# Patient Record
Sex: Male | Born: 1960 | Race: Black or African American | Hispanic: No | Marital: Single | Smoking: Never smoker
Health system: Southern US, Community
[De-identification: ages and names within clinical notes are randomized; demographics above are authoritative.]

## PROBLEM LIST (undated history)

## (undated) DIAGNOSIS — N529 Male erectile dysfunction, unspecified: Secondary | ICD-10-CM

## (undated) DIAGNOSIS — I10 Essential (primary) hypertension: Secondary | ICD-10-CM

## (undated) DIAGNOSIS — R002 Palpitations: Secondary | ICD-10-CM

## (undated) DIAGNOSIS — K4020 Bilateral inguinal hernia, without obstruction or gangrene, not specified as recurrent: Secondary | ICD-10-CM

## (undated) DIAGNOSIS — K219 Gastro-esophageal reflux disease without esophagitis: Secondary | ICD-10-CM

## (undated) DIAGNOSIS — E669 Obesity, unspecified: Secondary | ICD-10-CM

## (undated) DIAGNOSIS — R000 Tachycardia, unspecified: Secondary | ICD-10-CM

## (undated) DIAGNOSIS — I209 Angina pectoris, unspecified: Secondary | ICD-10-CM

## (undated) DIAGNOSIS — D649 Anemia, unspecified: Secondary | ICD-10-CM

## (undated) DIAGNOSIS — I499 Cardiac arrhythmia, unspecified: Secondary | ICD-10-CM

## (undated) DIAGNOSIS — E785 Hyperlipidemia, unspecified: Secondary | ICD-10-CM

## (undated) DIAGNOSIS — C801 Malignant (primary) neoplasm, unspecified: Secondary | ICD-10-CM

## (undated) DIAGNOSIS — G4730 Sleep apnea, unspecified: Secondary | ICD-10-CM

## (undated) DIAGNOSIS — R001 Bradycardia, unspecified: Secondary | ICD-10-CM

## (undated) HISTORY — PX: HERNIA REPAIR: SHX51

## (undated) HISTORY — PX: INGUINAL HERNIA REPAIR: SUR1180

---

## 2005-02-13 ENCOUNTER — Ambulatory Visit: Payer: Self-pay | Admitting: Internal Medicine

## 2005-08-16 ENCOUNTER — Other Ambulatory Visit: Payer: Self-pay

## 2005-08-16 ENCOUNTER — Emergency Department: Payer: Self-pay | Admitting: Emergency Medicine

## 2006-04-21 ENCOUNTER — Ambulatory Visit: Payer: Self-pay | Admitting: Gastroenterology

## 2006-08-11 ENCOUNTER — Ambulatory Visit: Payer: Self-pay | Admitting: Internal Medicine

## 2010-03-13 ENCOUNTER — Emergency Department: Payer: Self-pay | Admitting: Unknown Physician Specialty

## 2010-09-01 DIAGNOSIS — C801 Malignant (primary) neoplasm, unspecified: Secondary | ICD-10-CM

## 2010-09-01 DIAGNOSIS — C61 Malignant neoplasm of prostate: Secondary | ICD-10-CM

## 2010-09-01 HISTORY — DX: Malignant (primary) neoplasm, unspecified: C80.1

## 2010-09-01 HISTORY — PX: PROSTATECTOMY: SHX69

## 2010-09-01 HISTORY — DX: Malignant neoplasm of prostate: C61

## 2011-11-12 ENCOUNTER — Ambulatory Visit: Payer: Self-pay | Admitting: Family

## 2012-03-16 ENCOUNTER — Ambulatory Visit: Payer: Self-pay | Admitting: Cardiovascular Disease

## 2012-03-16 DIAGNOSIS — I2510 Atherosclerotic heart disease of native coronary artery without angina pectoris: Secondary | ICD-10-CM

## 2012-03-16 HISTORY — DX: Atherosclerotic heart disease of native coronary artery without angina pectoris: I25.10

## 2012-03-16 HISTORY — PX: LEFT HEART CATH AND CORONARY ANGIOGRAPHY: CATH118249

## 2012-10-19 ENCOUNTER — Other Ambulatory Visit: Payer: Self-pay | Admitting: Neurosurgery

## 2012-10-19 DIAGNOSIS — M792 Neuralgia and neuritis, unspecified: Secondary | ICD-10-CM

## 2012-10-19 DIAGNOSIS — M545 Low back pain: Secondary | ICD-10-CM

## 2012-10-27 ENCOUNTER — Other Ambulatory Visit: Payer: Self-pay

## 2012-11-23 ENCOUNTER — Inpatient Hospital Stay
Admission: RE | Admit: 2012-11-23 | Discharge: 2012-11-23 | Disposition: A | Discharge status: Home / Self Care | Payer: Self-pay | Source: Ambulatory Visit | Attending: Neurosurgery | Admitting: Neurosurgery

## 2012-11-23 ENCOUNTER — Other Ambulatory Visit: Payer: Self-pay | Admitting: Neurosurgery

## 2012-11-23 ENCOUNTER — Inpatient Hospital Stay: Admission: RE | Admit: 2012-11-23 | Payer: Self-pay | Source: Ambulatory Visit

## 2012-11-23 DIAGNOSIS — M549 Dorsalgia, unspecified: Secondary | ICD-10-CM

## 2013-03-01 ENCOUNTER — Ambulatory Visit
Admission: RE | Admit: 2013-03-01 | Discharge: 2013-03-01 | Disposition: A | Discharge status: Home / Self Care | Payer: BC Managed Care – PPO | Source: Ambulatory Visit | Attending: Neurosurgery | Admitting: Neurosurgery

## 2013-03-01 VITALS — BP 114/73 | HR 67 | Ht 76.0 in | Wt 255.0 lb

## 2013-03-01 DIAGNOSIS — M792 Neuralgia and neuritis, unspecified: Secondary | ICD-10-CM

## 2013-03-01 DIAGNOSIS — M545 Low back pain: Secondary | ICD-10-CM

## 2013-03-01 MED ORDER — IOHEXOL 180 MG/ML  SOLN
15.0000 mL | Freq: Once | INTRAMUSCULAR | Status: AC | PRN
Start: 2013-03-01 — End: 2013-03-01
  Administered 2013-03-01: 15 mL via INTRATHECAL

## 2013-03-01 MED ORDER — DIAZEPAM 5 MG PO TABS
10.0000 mg | ORAL_TABLET | Freq: Once | ORAL | Status: AC
  Administered 2013-03-01: 10 mg via ORAL

## 2013-03-01 NOTE — Progress Notes (Signed)
Last dose of tramadol was Saturday. Discharge instructions explained.

## 2013-03-07 ENCOUNTER — Other Ambulatory Visit: Payer: Self-pay

## 2013-06-01 DIAGNOSIS — B351 Tinea unguium: Secondary | ICD-10-CM

## 2016-09-01 HISTORY — PX: COLONOSCOPY: SHX174

## 2017-11-30 ENCOUNTER — Other Ambulatory Visit: Payer: Self-pay | Admitting: Urology

## 2017-11-30 DIAGNOSIS — N5089 Other specified disorders of the male genital organs: Secondary | ICD-10-CM

## 2017-12-04 ENCOUNTER — Ambulatory Visit
Admission: RE | Admit: 2017-12-04 | Discharge: 2017-12-04 | Disposition: A | Discharge status: Home / Self Care | Payer: BLUE CROSS/BLUE SHIELD | Source: Ambulatory Visit | Attending: Urology | Admitting: Urology

## 2017-12-04 DIAGNOSIS — N5089 Other specified disorders of the male genital organs: Secondary | ICD-10-CM

## 2017-12-04 DIAGNOSIS — N509 Disorder of male genital organs, unspecified: Secondary | ICD-10-CM | POA: Insufficient documentation

## 2017-12-04 DIAGNOSIS — N433 Hydrocele, unspecified: Secondary | ICD-10-CM | POA: Diagnosis not present

## 2018-07-20 ENCOUNTER — Encounter
Admission: RE | Admit: 2018-07-20 | Discharge: 2018-07-20 | Disposition: A | Discharge status: Home / Self Care | Payer: BLUE CROSS/BLUE SHIELD | Source: Ambulatory Visit | Attending: Urology | Admitting: Urology

## 2018-07-20 ENCOUNTER — Other Ambulatory Visit: Payer: Self-pay

## 2018-07-20 HISTORY — DX: Hyperlipidemia, unspecified: E78.5

## 2018-07-20 HISTORY — DX: Malignant (primary) neoplasm, unspecified: C80.1

## 2018-07-20 HISTORY — DX: Tachycardia, unspecified: R00.0

## 2018-07-20 HISTORY — DX: Cardiac arrhythmia, unspecified: I49.9

## 2018-07-20 HISTORY — DX: Anemia, unspecified: D64.9

## 2018-07-20 NOTE — Patient Instructions (Signed)
Your procedure is scheduled on: 07-27-18 Report to Same Day Surgery 2nd floor medical mall [facility] Entrance-take elevator on left to 2nd floor.  Check in with surgery information desk.) To find out your arrival time please call [phone] between 1PM - 3PM on 07-26-18  Remember: Instructions that are not followed completely may result in serious medical risk, up to and including death, or upon the discretion of your surgeon and anesthesiologist your surgery may need to be rescheduled.    _x___ 1. Do not eat food after midnight the night before your procedure. You may drink clear liquids up to 2 hours before you are scheduled to arrive at the hospital for your procedure.  Do not drink clear liquids within 2 hours of your scheduled arrival to the hospital.  Clear liquids include  --Water or Apple juice without pulp  --Clear carbohydrate beverage such as ClearFast or Gatorade  --Black Coffee or Clear Tea (No milk, no creamers, do not add anything to the coffee or Tea   ____Ensure clear carbohydrate drink on the way to the hospital for bariatric patients  ____Ensure clear carbohydrate drink 3 hours before surgery for Dr Dwyane Luo patients if physician instructed.   No gum chewing or hard candies.     __x__ 2. No Alcohol for 24 hours before or after surgery.   __x__3. No Smoking or e-cigarettes for 24 prior to surgery.  Do not use any chewable tobacco products for at least 6 hour prior to surgery   ____  4. Bring all medications with you on the day of surgery if instructed.    __x__ 5. Notify your doctor if there is any change in your medical condition     (cold, fever, infections).    x___6. On the morning of surgery brush your teeth with toothpaste and water.  You may rinse your mouth with mouth wash if you wish.  Do not swallow any toothpaste or mouthwash.   Do not wear jewelry, make-up, hairpins, clips or nail polish.  Do not wear lotions, powders, or perfumes. You may wear  deodorant.  Do not shave 48 hours prior to surgery. Men may shave face and neck.  Do not bring valuables to the hospital.    [facility] is not responsible for any belongings or valuables.               Contacts, dentures or bridgework may not be worn into surgery.  Leave your suitcase in the car. After surgery it may be brought to your room.  For patients admitted to the hospital, discharge time is determined by your  treatment team.  _  Patients discharged the day of surgery will not be allowed to drive home.  You will need someone to drive you home and stay with you the night of your procedure.    Please read over the following fact sheets that you were given:   [facility] Preparing for Surgery   ____ Take anti-hypertensive listed below, cardiac, seizure, asthma, anti-reflux and psychiatric medicines. These include:  1. NONE  2.  3.  4.  5.  6.  ____Fleets enema or Magnesium Citrate as directed.   ____ Use CHG Soap or sage wipes as directed on instruction sheet   ____ Use inhalers on the day of surgery and bring to hospital day of surgery  ____ Stop Metformin and Janumet 2 days prior to surgery.    ____ Take 1/2 of usual insulin dose the night before surgery and none  on the morning surgery.   _X___ Follow recommendations from Cardiologist, Pulmonologist or PCP regarding stopping Aspirin, Coumadin, Plavix ,Eliquis, Effient, or Pradaxa, and Pletal-ASK DR WOLFF ABOUT STOPPING YOUR ASPIRIN  X____Stop Anti-inflammatories such as Advil, Aleve, Ibuprofen, Motrin, Naproxen, Naprosyn, Goodies powders or aspirin products NOW- OK to take Tylenol    _X___ Stop supplements until after surgery-STOP FISH OIL AND VIT C NOW-MAY RESUME AFTER SURGERY   ____ Bring C-Pap to the hospital.

## 2018-07-20 NOTE — Pre-Procedure Instructions (Signed)
Progress Notes - documented in this encounter  Brett Gibbon, Bell - 05/26/2018 9:15 AM EDT Formatting of this note might be different from the original. Established Patient Visit   Chief Complaint: Chief Complaint  Patient presents with  . Follow-up  PALPS  Date of Service: 05/26/2018 Date of Birth: 21-Feb-1961 PCP: Brett Bell  History of Present Illness: Brett Bell is a 57 y.o.male patient who states he do not visibly well history of palpitations tachycardia had an episode recently which prompted a follow-up visit the patient is scheduled to go on a cruise with his brother tomorrow and he wanted to get checked out patient scheduled medicine [facility]ell Done in [place] he tries to exercise about 10 minutes a day but recently had another episode of palpitation tachycardia is been under a lot of stress recently denies any significant chest pain  Past Medical and Surgical History  Past Medical History Past Medical History:  Diagnosis Date  . History of palpitations  . History of prostate cancer  followed by Dr. Yves Bell  . Hyperlipidemia   Past Surgical History He has a past surgical history that includes Prostate cancer surgery, 2012; Colonoscopy (10/11/2012); and Colonoscopy (11/18/2016).   Medications and Allergies  Current Medications  Current Outpatient Medications  Medication Sig Dispense Refill  . ascorbic acid, vitamin C, (VITAMIN C) 250 MG tablet Take 250 mg by mouth once daily.  Marland Kitchen aspirin 81 MG EC tablet Take 81 mg by mouth once daily.  Marland Kitchen atorvastatin (LIPITOR) 20 MG tablet Take 20 mg by mouth once daily  . metoprolol succinate (TOPROL-XL) 25 MG XL tablet Take 25 mg by mouth once daily  . omega-3 fatty acids-vitamin E (FISH OIL) 1,000 mg Take 1 capsule by mouth once daily.  . predniSONE (DELTASONE) 10 MG tablet 6 pills x 1 day, 5 pills x 1 day, 4 pills x 1 day, 3 pills x 1 day, 2 pills x 1 day. 1 pill x 1 day. (Patient not taking: Reported on 04/30/2018 ) 21 tablet 0   . PSYLLIUM HUSK (METAMUCIL ORAL) Take by mouth once daily as needed.  . tadalafil (CIALIS) 5 MG tablet Take 5 mg by mouth as needed for Erectile Dysfunction.   No current facility-administered medications for this visit.   Allergies: Patient has no known allergies.  Social and Family History  Social History reports that he has never smoked. He has never used smokeless tobacco. He reports that he does not drink alcohol or use drugs.  Family History Family History  Problem Relation Age of Onset  . Colon cancer Mother  . Prostate cancer Father  . No Known Problems Sister  . Kidney cancer Brother  . Prostate cancer Brother  . No Known Problems Sister  . Colon polyps Neg Hx   Review of Systems   Review of Systems: The patient denies chest pain, shortness of breath, orthopnea, paroxysmal nocturnal dyspnea, pedal edema, palpitations, heart racing, presyncope, syncope. Review of 12 Systems is negative except as described above.  Physical Examination   Vitals:BP 114/60  Pulse 61  Ht 188 cm (6\' 2" )  Wt (!) 117.1 kg (258 lb 2.5 oz)  SpO2 95%  BMI 33.15 kg/m  Ht:188 cm (6\' 2" ) Wt:(!) 117.1 kg (258 lb 2.5 oz) YHC:WCBJ surface area is 2.47 meters squared. Bell mass index is 33.15 kg/m.  HEENT: Pupils equally reactive to light and accomodation  Neck: Supple without thyromegaly, carotid pulses 2+ Lungs: clear to auscultation bilaterally; no wheezes, rales, rhonchi Heart: Regular rate  and rhythm. No gallops, murmurs or rub Abdomen: soft nontender, nondistended, with normal bowel sounds Extremities: no cyanosis, clubbing, or edema Peripheral Pulses: 2+ in all extremities, 2+ femoral pulses bilaterally  Assessment   57 y.o. male with  1. Palpitation  2. Vertigo  3. Pure hypercholesterolemia (LDL 96 - 04/27/18)  4. Obesity (BMI 30.0-34.9), unspecified  5. History of prostate cancer  6. History of palpitations  7. Bradycardia   Plan  1 palpitations chronic intermittent  recurrent we will continue low-dose metoprolol 2 tachycardia again related to recent symptoms of palpitation would maintain beta-blockade therapy 3 modest obesity recommend modest weight loss exercise portion control 4 hypertension reasonably controlled continue metoprolol 5 obesity mild recommend weight loss exercise portion control 6 prostate cancer by history stable follow-up urology and oncology physician 7 we will try to obtain Holter report from previous cardiologist 8 have the patient follow-up in 6 months  Return in about 6 months (around 11/24/2018).  Brett Prince [place], Bell  This dictation was prepared with dragon dictation. Any transcription errors that result from this process are unintentional.   Electronically signed by Brett Gibbon, Bell at 05/31/2018 12:47 PM EDT  Plan of Treatment - documented as of this encounter  Upcoming Encounters Upcoming Encounters  Date Type Specialty Care Team Description  10/28/2018 Ancillary Orders Lab Brett Bell  [place]  [facility] [place], [place] [address]  [phone]  [phone] (Fax)    11/04/2018 Office Visit Internal Medicine Brett Bell  [place]  [facility] [place], [place] [address]  [phone]  [phone]    11/23/2018 Office Visit Cardiology Brett Gibbon, Bell  [facility]  [place], [place] [address]  [phone]  [phone] (Fax)    Goals - documented as of this encounter  Goal Patient Goal Type Associated Problems Recent Progress Patient-Stated? Author  Lose Weight  Weight   Yes Brett Moll, RN  Note:   Lose 20 pounds from 10/29/17 to 03/2018    Visit Diagnoses - documented in this encounter  Diagnosis  Palpitation - Primary  Palpitations   Vertigo  Dizziness and giddiness   Pure hypercholesterolemia (LDL 96 - 04/27/18)  Pure  hypercholesterolemia   Obesity (BMI 30.0-34.9), unspecified   History of prostate cancer  Personal history of malignant neoplasm of prostate   History of palpitations   Bradycardia  Other specified cardiac dysrhythmias   Images Patient Contacts   Contact Name Contact Address Communication Relationship to Patient  Brett Bell Unknown [phone] [facility]) Brother or Sister, Emergency Contact  Document Information  Primary Care Provider Other Service Providers Document Coverage Dates  Brett Bell (Apr. 08, 2015April 08, 2015 - Present) DM: [phone] (Work) [phone] (Fax) [place] [facility] [place], [place] [address] Family Medicine [facility] [address] [place], [place] [address]  Sep. 25, 2019September 25, 2019   [place] [address] [place], [place] [address]   Encounter Providers Encounter Date  Brett Aida Raider, Bell (Attending) DM: [phone] (Work) [phone] (Fax) [place], [place] [address] Cardiovascular Disease Sep. 25, 2019September 25, 2019

## 2018-07-23 NOTE — H&P (Signed)
NAME: Brett, Bell MEDICAL RECORD [id number]:[id number] ACCOUNT [id number] DATE OF BIRTH:04/25/1961 FACILITY: [facility] LOCATION: [facility] PHYSICIAN:Denell Cothern Farrel Conners, MD  HISTORY AND PHYSICAL  DATE OF ADMISSION:  07/27/2018  SAME DAY SURGERY:  11/26  CHIEF COMPLAINT:  Scrotal swelling.  HISTORY OF PRESENT ILLNESS:  The patient is a 57 year old African-American male with a scrotal and perineal mass for several years.  The mass causes discomfort on occasion.  Scrotal ultrasound in April revealed that he had a 7.6 cm hypoechoic mass in the  perineum in the lower aspect of the mid scrotum.  He comes in now for excisional biopsy of this mass.  PAST MEDICAL HISTORY:   ALLERGIES:  No drug allergies.  CURRENT MEDICATIONS:  Crestor, metoprolol, vitamin C and Viagra.  PAST SURGICAL HISTORY:   1.  Circumcision in 1979. 2.  Left inguinal herniorrhaphy 2004.   3.  HIFU for prostate cancer in 2012.  PAST AND CURRENT MEDICAL CONDITIONS:   1.  Hypercholesterolemia. 2.  History of heart palpitations. 3.  Prostate cancer. 4.  Erectile dysfunction.  REVIEW OF SYSTEMS:  The patient denies chest pain, shortness of breath, diabetes, stroke or hypertension.  SOCIAL HISTORY:  The patient denied tobacco or alcohol use.  FAMILY HISTORY:  Father died at age 82 of heart disease and prostate cancer.  Mother died at age 30 of colon cancer.  PHYSICAL EXAMINATION: GENERAL:  A well-nourished African-American male in no distress. HEENT:  Sclerae are clear.  Pupils are equally round, reactive to light and accommodation.  Extraocular movements are intact. NECK:  No palpable cervical adenopathy. PULMONARY:  Lungs clear to auscultation. CARDIOVASCULAR:  Regular rhythm and rate without audible murmurs. ABDOMEN:  Soft, nontender abdomen. GENITOURINARY:  The patient was uncircumcised.  Testes were smooth, nontender, 20 mL size each.  He had a perineal mass in the lower aspect of the lower mid scrotum. RECTAL:  No  palpable rectal masses.  Prostate and seminal vesicles were not palpable. NEUROMUSCULAR:  Alert and oriented x3.  IMPRESSION:  Scrotal and perineal mass.  PLAN:  Excisional biopsy of scrotal and perineal mass.  TN/NUANCE  D:07/23/2018 T:07/23/2018 JOB:[id number]

## 2018-07-26 NOTE — Pre-Procedure Instructions (Signed)
ECG 12-lead9/08/2018 [place] Component Name Value Ref Range  Vent Rate (bpm) 51   PR Interval (msec) 156   QRS Interval (msec) 90   QT Interval (msec) 406   QTc (msec) 374   Other Result Information  This result has an attachment that is not available.  Result Narrative  Sinus bradycardia Minimal voltage criteria for LVH, may be normal variant Borderline ECG No previous ECGs available I reviewed and concur with this report. Electronically signed TX:[id number] MD, DWAYNE ([id number]) on 05/17/2018 3:26:17 PM  Status Results Details

## 2018-07-27 ENCOUNTER — Encounter
Admission: RE | Disposition: A | Discharge status: Home / Self Care | Payer: Self-pay | Source: Ambulatory Visit | Attending: Urology

## 2018-07-27 ENCOUNTER — Ambulatory Visit: Payer: BLUE CROSS/BLUE SHIELD | Admitting: Anesthesiology

## 2018-07-27 ENCOUNTER — Ambulatory Visit
Admission: RE | Admit: 2018-07-27 | Discharge: 2018-07-27 | Disposition: A | Discharge status: Home / Self Care | Payer: BLUE CROSS/BLUE SHIELD | Source: Ambulatory Visit | Attending: Urology | Admitting: Urology

## 2018-07-27 ENCOUNTER — Encounter: Payer: Self-pay | Admitting: Anesthesiology

## 2018-07-27 DIAGNOSIS — L720 Epidermal cyst: Secondary | ICD-10-CM | POA: Insufficient documentation

## 2018-07-27 DIAGNOSIS — Z79899 Other long term (current) drug therapy: Secondary | ICD-10-CM | POA: Insufficient documentation

## 2018-07-27 DIAGNOSIS — Q559 Congenital malformation of male genital organ, unspecified: Secondary | ICD-10-CM

## 2018-07-27 DIAGNOSIS — N5089 Other specified disorders of the male genital organs: Secondary | ICD-10-CM | POA: Insufficient documentation

## 2018-07-27 DIAGNOSIS — E7800 Pure hypercholesterolemia, unspecified: Secondary | ICD-10-CM | POA: Insufficient documentation

## 2018-07-27 DIAGNOSIS — Z8546 Personal history of malignant neoplasm of prostate: Secondary | ICD-10-CM | POA: Insufficient documentation

## 2018-07-27 HISTORY — PX: SCROTAL EXPLORATION: SHX2386

## 2018-07-27 SURGERY — EXPLORATION, SCROTUM
Anesthesia: General | Site: Scrotum

## 2018-07-27 MED ORDER — EPHEDRINE SULFATE 50 MG/ML IJ SOLN
INTRAMUSCULAR | Status: AC
  Filled 2018-07-27: qty 1

## 2018-07-27 MED ORDER — KETOROLAC TROMETHAMINE 30 MG/ML IJ SOLN
INTRAMUSCULAR | Status: DC | PRN
  Administered 2018-07-27: 30 mg via INTRAVENOUS

## 2018-07-27 MED ORDER — ACETAMINOPHEN-CODEINE #3 300-30 MG PO TABS
ORAL_TABLET | ORAL | Status: AC
  Filled 2018-07-27: qty 1

## 2018-07-27 MED ORDER — KETOROLAC TROMETHAMINE 30 MG/ML IJ SOLN
INTRAMUSCULAR | Status: AC
  Filled 2018-07-27: qty 1

## 2018-07-27 MED ORDER — LACTATED RINGERS IV SOLN
INTRAVENOUS | Status: DC
  Administered 2018-07-27: 13:00:00 via INTRAVENOUS

## 2018-07-27 MED ORDER — MIDAZOLAM HCL 2 MG/2ML IJ SOLN
INTRAMUSCULAR | Status: DC | PRN
  Administered 2018-07-27: 2 mg via INTRAVENOUS

## 2018-07-27 MED ORDER — DOCUSATE SODIUM 100 MG PO CAPS: 200.0000 mg | ORAL_CAPSULE | Freq: Two times a day (BID) | ORAL | 3 refills | Status: DC

## 2018-07-27 MED ORDER — LIDOCAINE HCL (CARDIAC) PF 100 MG/5ML IV SOSY
PREFILLED_SYRINGE | INTRAVENOUS | Status: DC | PRN
  Administered 2018-07-27: 100 mg via INTRAVENOUS

## 2018-07-27 MED ORDER — FAMOTIDINE 20 MG PO TABS
20.0000 mg | ORAL_TABLET | Freq: Once | ORAL | Status: AC
  Administered 2018-07-27: 20 mg via ORAL

## 2018-07-27 MED ORDER — ONDANSETRON HCL 4 MG/2ML IJ SOLN: 4.0000 mg | Freq: Once | INTRAMUSCULAR | Status: DC | PRN

## 2018-07-27 MED ORDER — LIDOCAINE HCL (PF) 1 % IJ SOLN
INTRAMUSCULAR | Status: AC
  Filled 2018-07-27: qty 30

## 2018-07-27 MED ORDER — FENTANYL CITRATE (PF) 100 MCG/2ML IJ SOLN
INTRAMUSCULAR | Status: AC
  Administered 2018-07-27: 25 ug via INTRAVENOUS
  Filled 2018-07-27: qty 2

## 2018-07-27 MED ORDER — ACETAMINOPHEN 10 MG/ML IV SOLN
INTRAVENOUS | Status: AC
  Filled 2018-07-27: qty 100

## 2018-07-27 MED ORDER — FAMOTIDINE 20 MG PO TABS
ORAL_TABLET | ORAL | Status: AC
  Administered 2018-07-27: 20 mg via ORAL
  Filled 2018-07-27: qty 1

## 2018-07-27 MED ORDER — EPHEDRINE SULFATE 50 MG/ML IJ SOLN
INTRAMUSCULAR | Status: DC | PRN
  Administered 2018-07-27: 10 mg via INTRAVENOUS

## 2018-07-27 MED ORDER — FENTANYL CITRATE (PF) 100 MCG/2ML IJ SOLN
INTRAMUSCULAR | Status: DC | PRN
  Administered 2018-07-27 (×2): 25 ug via INTRAVENOUS

## 2018-07-27 MED ORDER — CEPHALEXIN 500 MG PO CAPS: 500.0000 mg | ORAL_CAPSULE | Freq: Four times a day (QID) | ORAL | 0 refills | Status: DC

## 2018-07-27 MED ORDER — FENTANYL CITRATE (PF) 100 MCG/2ML IJ SOLN
INTRAMUSCULAR | Status: AC
  Filled 2018-07-27: qty 2

## 2018-07-27 MED ORDER — BUPIVACAINE HCL (PF) 0.5 % IJ SOLN
INTRAMUSCULAR | Status: AC
  Filled 2018-07-27: qty 30

## 2018-07-27 MED ORDER — DEXAMETHASONE SODIUM PHOSPHATE 10 MG/ML IJ SOLN
INTRAMUSCULAR | Status: DC | PRN
  Administered 2018-07-27: 10 mg via INTRAVENOUS

## 2018-07-27 MED ORDER — CEFAZOLIN SODIUM-DEXTROSE 1-4 GM/50ML-% IV SOLN
1.0000 g | Freq: Once | INTRAVENOUS | Status: AC
  Administered 2018-07-27: 2 g via INTRAVENOUS

## 2018-07-27 MED ORDER — CEFAZOLIN SODIUM-DEXTROSE 2-4 GM/100ML-% IV SOLN
INTRAVENOUS | Status: AC
  Filled 2018-07-27: qty 100

## 2018-07-27 MED ORDER — MIDAZOLAM HCL 2 MG/2ML IJ SOLN
INTRAMUSCULAR | Status: AC
  Filled 2018-07-27: qty 2

## 2018-07-27 MED ORDER — ACETAMINOPHEN 10 MG/ML IV SOLN
INTRAVENOUS | Status: DC | PRN
  Administered 2018-07-27: 1000 mg via INTRAVENOUS

## 2018-07-27 MED ORDER — ACETAMINOPHEN-CODEINE #3 300-30 MG PO TABS: 1.0000 | ORAL_TABLET | ORAL | 0 refills | Status: DC | PRN

## 2018-07-27 MED ORDER — FENTANYL CITRATE (PF) 100 MCG/2ML IJ SOLN
25.0000 ug | INTRAMUSCULAR | Status: DC | PRN
  Administered 2018-07-27 (×2): 25 ug via INTRAVENOUS

## 2018-07-27 MED ORDER — PROPOFOL 10 MG/ML IV BOLUS
INTRAVENOUS | Status: AC
  Filled 2018-07-27: qty 20

## 2018-07-27 MED ORDER — ONDANSETRON HCL 4 MG/2ML IJ SOLN
INTRAMUSCULAR | Status: DC | PRN
  Administered 2018-07-27: 4 mg via INTRAVENOUS

## 2018-07-27 MED ORDER — ACETAMINOPHEN-CODEINE #3 300-30 MG PO TABS
1.0000 | ORAL_TABLET | Freq: Once | ORAL | Status: AC
  Administered 2018-07-27: 1 via ORAL

## 2018-07-27 MED ORDER — PROPOFOL 10 MG/ML IV BOLUS
INTRAVENOUS | Status: DC | PRN
  Administered 2018-07-27: 200 mg via INTRAVENOUS

## 2018-07-27 SURGICAL SUPPLY — 26 items
CANISTER SUCT 1200ML W/VALVE (MISCELLANEOUS) ×3 IMPLANT
CLOSURE WOUND 1/2 X4 (GAUZE/BANDAGES/DRESSINGS) ×1
COVER WAND RF STERILE (DRAPES) ×3 IMPLANT
DRAIN PENROSE 1/4X12 LTX (DRAIN) ×3 IMPLANT
DRAPE LAPAROTOMY 100X77 ABD (DRAPES) ×3 IMPLANT
DRSG GAUZE FLUFF 36X18 (GAUZE/BANDAGES/DRESSINGS) ×2 IMPLANT
ELECT REM PT RETURN 9FT ADLT (ELECTROSURGICAL) ×3
ELECTRODE REM PT RTRN 9FT ADLT (ELECTROSURGICAL) ×1 IMPLANT
GLOVE BIO SURGEON STRL SZ7.5 (GLOVE) ×5 IMPLANT
GOWN STRL REUS W/ TWL LRG LVL3 (GOWN DISPOSABLE) ×1 IMPLANT
GOWN STRL REUS W/ TWL XL LVL3 (GOWN DISPOSABLE) ×1 IMPLANT
GOWN STRL REUS W/TWL LRG LVL3 (GOWN DISPOSABLE) ×2
GOWN STRL REUS W/TWL XL LVL3 (GOWN DISPOSABLE) ×2
KIT TURNOVER KIT A (KITS) ×3 IMPLANT
LABEL OR SOLS (LABEL) ×3 IMPLANT
PACK BASIN MINOR ARMC (MISCELLANEOUS) ×3 IMPLANT
STRAP SAFETY 5IN WIDE (MISCELLANEOUS) ×3 IMPLANT
STRIP CLOSURE SKIN 1/2X4 (GAUZE/BANDAGES/DRESSINGS) ×2 IMPLANT
SUPPORTER ATHLETIC XL (MISCELLANEOUS) ×2
SUPPORTER ATHLETIC XL 3X44-50X (MISCELLANEOUS) IMPLANT
SUT PLAIN 3-0 (SUTURE) ×3 IMPLANT
SUT VIC AB 3-0 SH 27 (SUTURE) ×2
SUT VIC AB 3-0 SH 27X BRD (SUTURE) ×1 IMPLANT
SUT VICRYL 5-0 (SUTURE) ×3 IMPLANT
SUT VICRYL+ 3-0 144IN (SUTURE) ×3 IMPLANT
TRAY PREP VAG/GEN (MISCELLANEOUS) ×3 IMPLANT

## 2018-07-27 NOTE — Anesthesia Procedure Notes (Signed)
Procedure Name: LMA Insertion Date/Time: 07/27/2018 1:05 PM Performed by: Doreen Salvage, CRNA Pre-anesthesia Checklist: Patient identified, Patient being monitored, Timeout performed, Emergency Drugs available and Suction available Patient Re-evaluated:Patient Re-evaluated prior to induction Oxygen Delivery Method: Circle system utilized Preoxygenation: Pre-oxygenation with 100% oxygen Induction Type: IV induction Ventilation: Mask ventilation without difficulty LMA: LMA inserted LMA Size: 3.5 Tube type: Oral Number of attempts: 1 Placement Confirmation: positive ETCO2 and breath sounds checked- equal and bilateral Tube secured with: Tape Dental Injury: Teeth and Oropharynx as per pre-operative assessment

## 2018-07-27 NOTE — Anesthesia Preprocedure Evaluation (Signed)
Anesthesia Evaluation  Patient identified by MRN, date of birth, ID band Patient awake    Reviewed: Allergy & Precautions, NPO status , Patient's Chart, lab work & pertinent test results, reviewed documented beta blocker date and time   Airway Mallampati: III  TM Distance: >3 FB     Dental  (+) Chipped   Pulmonary           Cardiovascular + dysrhythmias      Neuro/Psych    GI/Hepatic   Endo/Other    Renal/GU      Musculoskeletal   Abdominal   Peds  Hematology  (+) anemia ,   Anesthesia Other Findings   Reproductive/Obstetrics                             Anesthesia Physical Anesthesia Plan  ASA: II  Anesthesia Plan: General   Post-op Pain Management:    Induction: Intravenous  PONV Risk Score and Plan:   Airway Management Planned: LMA  Additional Equipment:   Intra-op Plan:   Post-operative Plan:   Informed Consent: I have reviewed the patients History and Physical, chart, labs and discussed the procedure including the risks, benefits and alternatives for the proposed anesthesia with the patient or authorized representative who has indicated his/her understanding and acceptance.     Plan Discussed with: CRNA  Anesthesia Plan Comments:         Anesthesia Quick Evaluation

## 2018-07-27 NOTE — Anesthesia Post-op Follow-up Note (Signed)
Anesthesia QCDR form completed.        

## 2018-07-27 NOTE — Op Note (Signed)
Preoperative diagnosis scrotal mass  Postoperative diagnosis: Same  Procedure: Excisional biopsy of scrotal mass  Surgeon: Otelia Limes. Yves Dill MD  Anesthesia: General  Indications:See the history and physical. After informed consent the above procedure(s) were requested     Technique and findings: After adequate general anesthesia had been obtained the perineum was prepped and draped in usual fashion.  Patient had a inferior and midline total mass measuring 10 cm in diameter.  A midline raphae incision was made and carried down sharply to the mass.  Dartos fascia was bluntly and sharply dissected off of the mass.  Mass was then submitted to pathology.  Bleeders were controlled with electrocautery and 3-0 chromic sutures.  The surgical field was irrigated with sterile water.  A small Penrose drain was brought out through the inferior aspect of the scrotum and anchored to the skin with a 3-0 nylon suture.  Dartos fascia was then reapproximated with running 3-0 chromic suture.  Skin was approximated with interrupted 3-0 chromic suture.  Sterile dressing and scrotal supporter were applied.  Sponge, instruments, and needle counts were noted be correct.  Blood loss was minimal.  The procedure was then terminated and patient transferred to the recovery room in stable condition.

## 2018-07-27 NOTE — Transfer of Care (Signed)
Immediate Anesthesia Transfer of Care Note  Patient: Brett Bell  Procedure(s) Performed: Procedure(s): SCROTUM EXPLORATION (N/A)  Patient Location: PACU  Anesthesia Type:General  Level of Consciousness: sedated  Airway & Oxygen Therapy: Patient Spontanous Breathing and Patient connected to face mask oxygen  Post-op Assessment: Report given to RN and Post -op Vital signs reviewed and stable  Post vital signs: Reviewed and stable  Last Vitals:  Vitals:   07/27/18 1430 07/27/18 1445  BP: 112/83 120/80  Pulse: 70 63  Resp: (!) 21 15  Temp: (!) 36.1 C   SpO2: 456% 256%    Complications: No apparent anesthesia complications

## 2018-07-27 NOTE — Discharge Instructions (Addendum)
AMBULATORY SURGERY  DISCHARGE INSTRUCTIONS   1) The drugs that you were given will stay in your system until tomorrow so for the next 24 hours you should not:  A) Drive an automobile B) Make any legal decisions C) Drink any alcoholic beverage   2) You may resume regular meals tomorrow.  Today it is better to start with liquids and gradually work up to solid foods.  You may eat anything you prefer, but it is better to start with liquids, then soup and crackers, and gradually work up to solid foods.   3) Please notify your doctor immediately if you have any unusual bleeding, trouble breathing, redness and pain at the surgery site, drainage, fever, or pain not relieved by medication. 4)   5) Your post-operative visit with Dr.                                     is: Date:                        Time:    Please call to schedule your post-operative visit.  6) Additional Instructions:     Epidermal Cyst Removal, Care After Refer to this sheet in the next few weeks. These instructions provide you with information about caring for yourself after your procedure. Your health care provider may also give you more specific instructions. Your treatment has been planned according to current medical practices, but problems sometimes occur. Call your health care provider if you have any problems or questions after your procedure. What can I expect after the procedure? After the procedure, it is common to have:  Soreness in the area where your cyst was removed.  Tightness or itching from your skin sutures.  Follow these instructions at home:  Take medicines only as directed by your health care provider.  If you were prescribed an antibiotic medicine, finish all of it even if you start to feel better.  Use antibiotic ointment as directed by your health care provider. Follow the instructions carefully.  There are many different ways to close and cover an incision, including stitches  (sutures), skin glue, and adhesive strips. Follow your health care provider's instructions about: ? Incision care. ? Bandage (dressing) changes and removal. ? Incision closure removal.  Keep the bandage (dressing) dry until your health care provider says that it can be removed. Take sponge baths only. Ask your health care provider when you can start showering or taking a bath.  After your dressing is off, check your incision every day for signs of infection. Watch for: ? Redness, swelling, or pain. ? Fluid, blood, or pus.  You can return to your normal activities. Do not do anything that stretches or puts pressure on your incision.  You can return to your normal diet.  Keep all follow-up visits as directed by your health care provider. This is important. Contact a health care provider if:  You have a fever.  Your incision bleeds.  You have redness, swelling, or pain in the incision area.  You have fluid, blood, or pus coming from your incision.  Your cyst comes back after surgery. This information is not intended to replace advice given to you by your health care provider. Make sure you discuss any questions you have with your health care provider. Document Released: 09/08/2014 Document Revised: 01/24/2016 Document Reviewed: 05/03/2014 Elsevier Interactive Patient Education  2018 [employer] Scrotal Swelling Scrotal swelling may occur on one or both sides of the scrotum. Pain may also occur with swelling. Possible causes of scrotal swelling include:  Injury.  Infection.  An ingrown hair or abrasion in the area.  Repeated rubbing from tight-fitting underwear.  Poor hygiene.  A weakened area in the muscles around the groin (hernia). A hernia can allow abdominal contents to push into the scrotum.  Fluid around the testicle (hydrocele).  Enlarged vein around the testicle (varicocele).  Certain medical treatments or existing conditions.  A recent genital surgery or  procedure.  The spermatic cord becomes twisted in the scrotum, which cuts off blood supply (testicular torsion).  Testicular cancer.  Follow these instructions at home: Once the cause of your scrotal swelling has been determined, you may be asked to monitor your scrotum for any changes. The following actions may help to alleviate any discomfort you are experiencing:  Rest and limit activity until the swelling goes away. Lying down is the preferred position.  Put ice on the scrotum: ? Put ice in a plastic bag. ? Place a towel between your skin and the bag. ? Leave the ice on for 20 minutes, 2-3 times a day for 1-2 days.  Place a rolled towel under the testicles for support.  Wear loose-fitting clothing or an athletic support cup for comfort.  Take all medicines as directed by your health care provider.  Perform a monthly self-exam of the scrotum and penis. Feel for changes. Ask your health care provider how to perform a monthly self-exam if you are unsure.  Contact a health care provider if:  You have a sudden (acute) onset of pain that is persistent and not improving.  You notice a heavy feeling or fluid in the scrotum.  You have pain or burning while urinating.  You have blood in the urine or semen.  You feel a lump around the testicle.  You notice that one testicle is larger than the other (slight variation is normal).  You have a persistent dull ache or pain in the groin or scrotum. Get help right away if:  The pain does not go away or becomes severe.  You have a fever or shaking chills.  You have pain or vomiting that cannot be controlled.  You notice significant redness or swelling of one or both sides of the scrotum.  You experience redness spreading upward from your scrotum to your abdomen or downward from your scrotum to your thighs. This information is not intended to replace advice given to you by your health care provider. Make sure you discuss any  questions you have with your health care provider. Document Released: 09/20/2010 Document Revised: 03/07/2016 Document Reviewed: 01/20/2013 Elsevier Interactive Patient Education  2018 [place] INSTRUCTIONS   7) The drugs that you were given will stay in your system until tomorrow so for the next 24 hours you should not:  D) Drive an automobile E) Make any legal decisions F) Drink any alcoholic beverage   8) You may resume regular meals tomorrow.  Today it is better to start with liquids and gradually work up to solid foods.  You may eat anything you prefer, but it is better to start with liquids, then soup and crackers, and gradually work up to solid foods.   9) Please notify your doctor immediately if you have any unusual bleeding, trouble breathing, redness and pain at the surgery site, drainage, fever, or pain not  relieved by medication.    10) Additional Instructions:        Please contact your physician with any problems or Same Day Surgery at [phone], Monday through Friday 6 am to 4 pm, or [place] at [facility] number at [phone].

## 2018-07-27 NOTE — H&P (Signed)
Date of Initial H&P: 07/27/18  History reviewed, patient examined, no change in status, stable for surgery.

## 2018-07-27 NOTE — Anesthesia Postprocedure Evaluation (Signed)
Anesthesia Post Note  Patient: Brett Bell  Procedure(s) Performed: SCROTUM EXPLORATION (N/A Scrotum)  Patient location during evaluation: PACU Anesthesia Type: General Level of consciousness: awake and alert Pain management: pain level controlled Vital Signs Assessment: post-procedure vital signs reviewed and stable Respiratory status: spontaneous breathing, nonlabored ventilation, respiratory function stable and patient connected to nasal cannula oxygen Cardiovascular status: blood pressure returned to baseline and stable Postop Assessment: no apparent nausea or vomiting Anesthetic complications: no     Last Vitals:  Vitals:   07/27/18 1505 07/27/18 1513  BP:  116/82  Pulse: (!) 48 (!) 46  Resp: 18 16  Temp:  (!) 36.1 C  SpO2: 99% 96%    Last Pain:  Vitals:   07/27/18 1529  TempSrc:   PainSc: [place]

## 2018-07-28 ENCOUNTER — Encounter: Payer: Self-pay | Admitting: Urology

## 2018-08-02 LAB — SURGICAL PATHOLOGY

## 2018-10-16 IMAGING — US US SCROTUM W/ DOPPLER COMPLETE
1 series · 13 of 25 positions shown · non-contrast
Comparison: None.

CLINICAL DATA: Inferior testicular mass for 69 years.

EXAM:
SCROTAL ULTRASOUND
DOPPLER ULTRASOUND OF THE TESTICLES
TECHNIQUE: Complete ultrasound examination of the testicles, epididymis, and
other scrotal structures was performed. Color and spectral Doppler
ultrasound were also utilized to evaluate blood flow to the
testicles.

[Series 1: us scrotum w/ doppler complete · 0.06mm/px · 102 acquisitions, 13 frames shown]
[im 1/102]
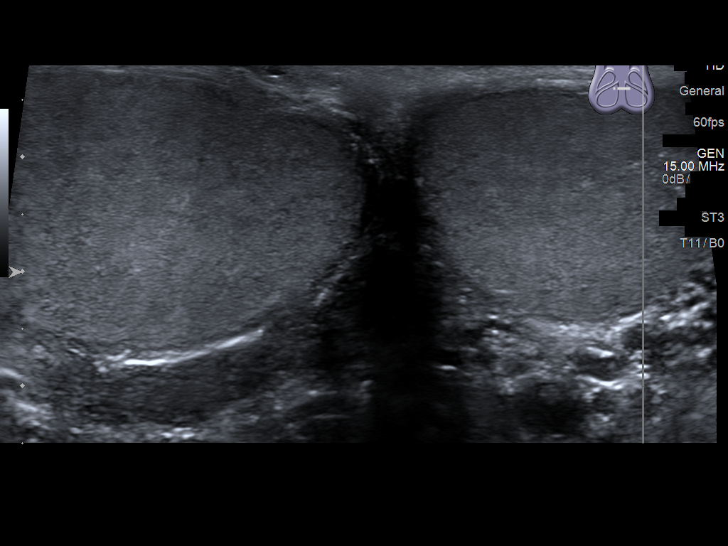
[im 9/102]
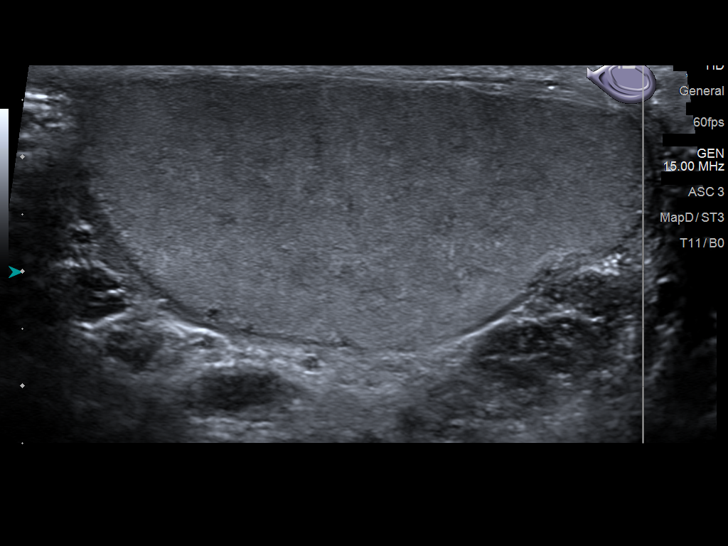
[im 17/102]
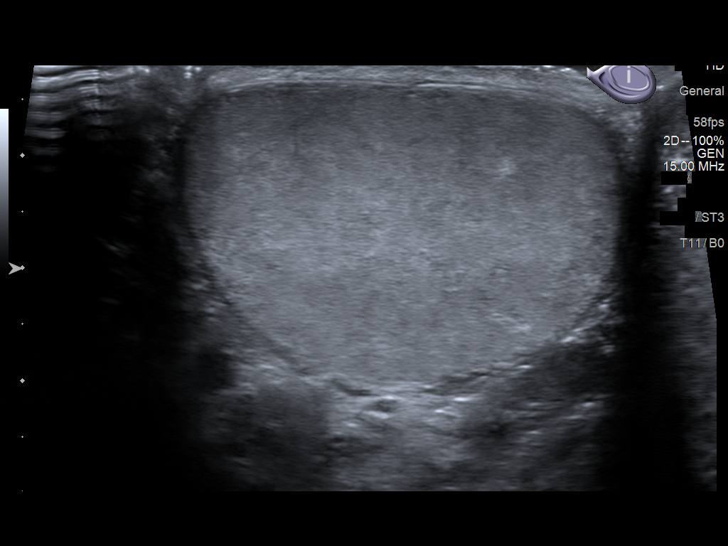
[im 26/102]
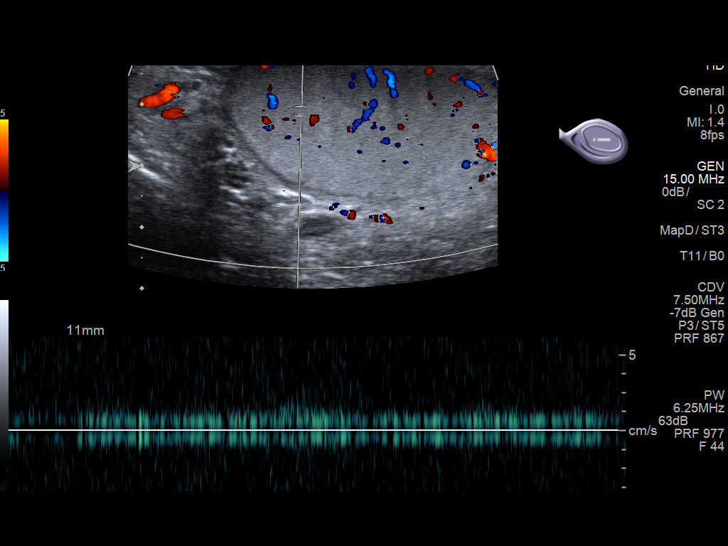
[im 34/102]
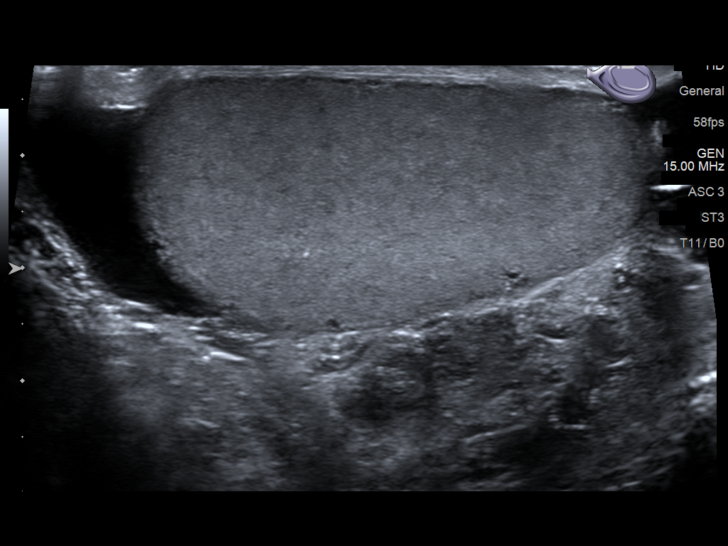
[im 43/102]
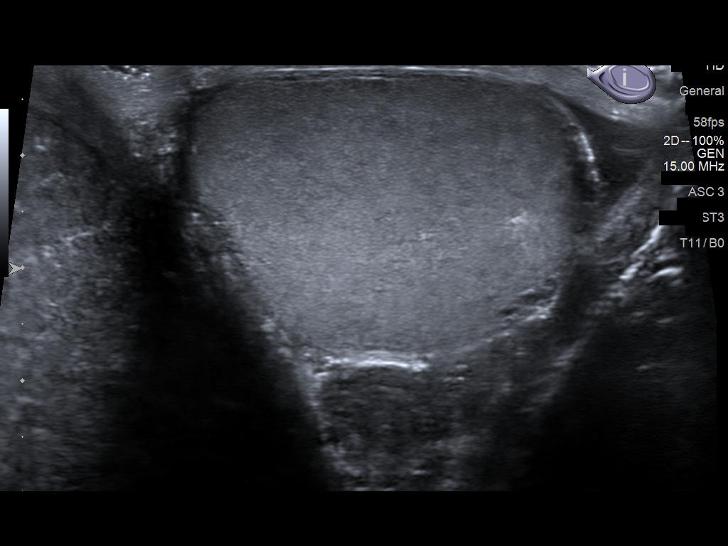
[im 51/102]
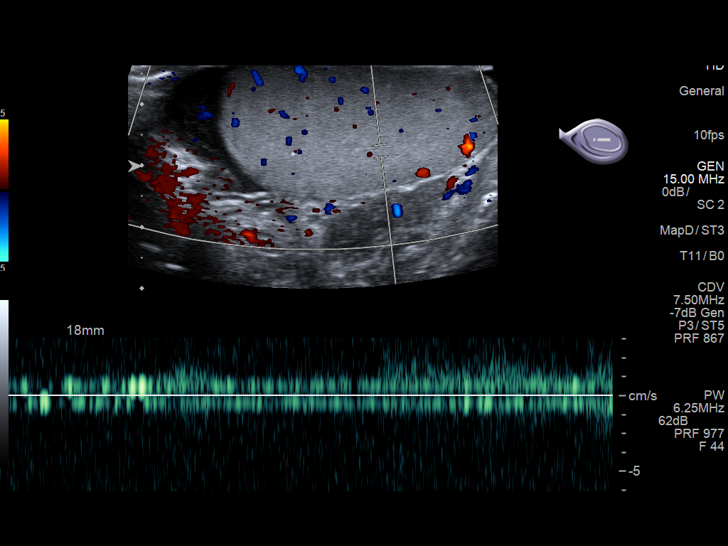
[im 59/102]
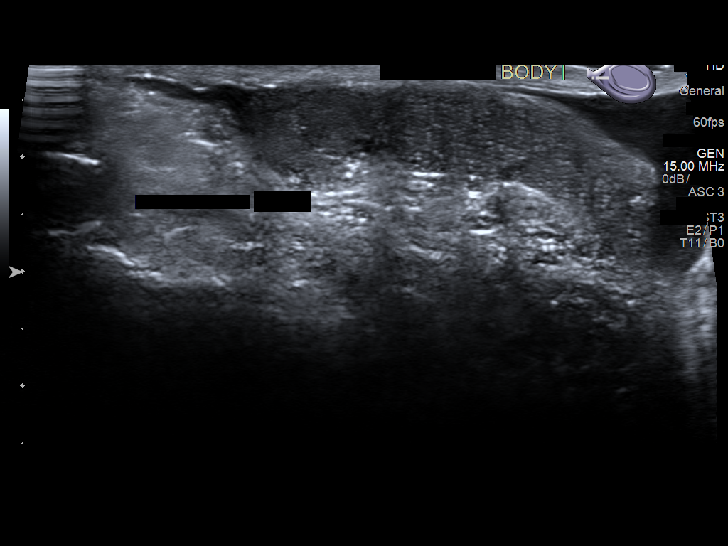
[im 68/102]
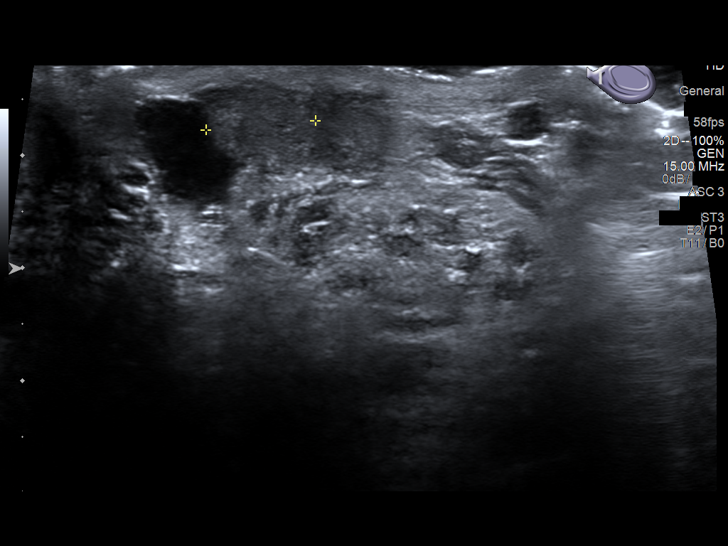
[im 76/102]
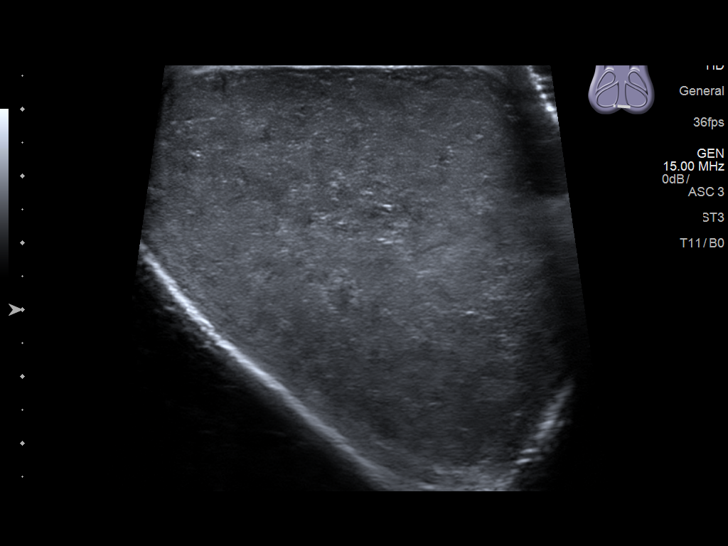
[im 85/102]
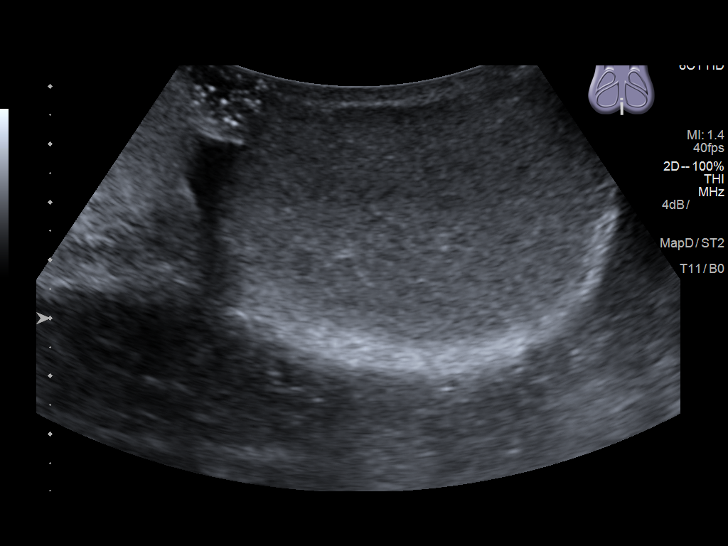
[im 93/102]
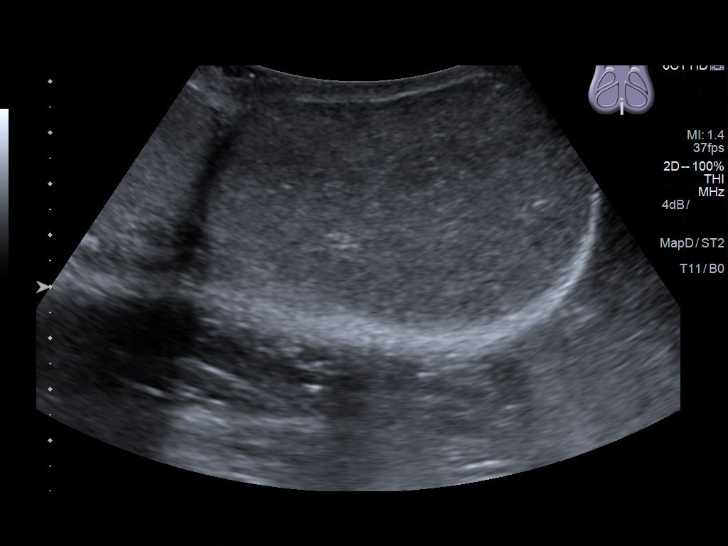
[im 102/102]
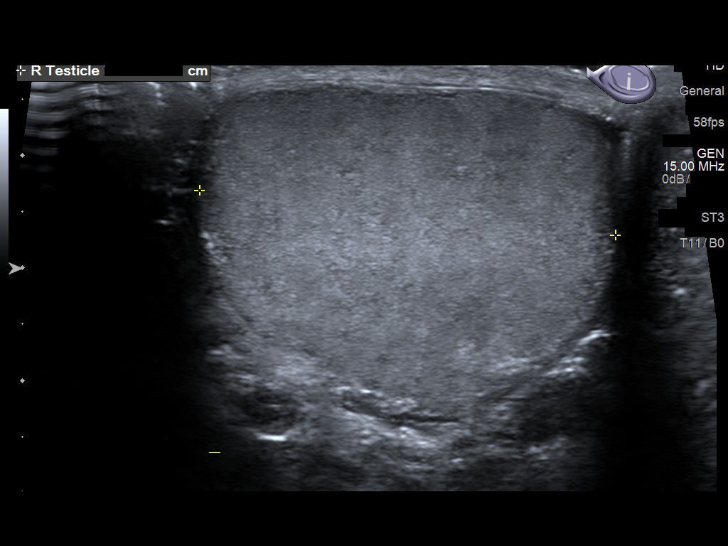

[13 of 25 positions shown; findings below may reference images not displayed]

FINDINGS: Right testicle

Measurements: 5.1 x 2.5 x 3.7 cm. No mass or microlithiasis
visualized.

Left testicle

Measurements: 4.8 x 2.4 x 3.7 cm. No mass or microlithiasis
visualized.

Right epididymis:  Normal in size and appearance.

Left epididymis:  Normal in size and appearance.

Hydrocele:  There are small bilateral hydroceles.

Varicocele:  None visualized.

Pulsed Doppler interrogation of both testes demonstrates normal low
resistance arterial and venous waveforms bilaterally.

Other findings: Between the bilateral testis in the midline of the
mid inferior scrotum, there is a heterogeneous soft tissue mass
which is hypoechoic relative to the testis and measures 7.6 x 5.1 x
7.6 cm.
IMPRESSION: Between the bilateral testis in the midline of the mid inferior
scrotum, there is a heterogeneous soft tissue mass which is
hypoechoic relative to the testis and measures 7.6 x 5.1 x 7.6 cm.
This mass appears to be separate from the bilateral testis.

The bilateral testis are normal.

Small bilateral hydroceles.

## 2019-08-02 ENCOUNTER — Other Ambulatory Visit: Payer: Self-pay

## 2019-08-02 DIAGNOSIS — Z20822 Contact with and (suspected) exposure to covid-19: Secondary | ICD-10-CM

## 2019-08-04 LAB — NOVEL CORONAVIRUS, NAA: SARS-CoV-2, NAA: NOT DETECTED

## 2019-08-05 ENCOUNTER — Telehealth: Payer: Self-pay | Admitting: Family Medicine

## 2019-08-05 NOTE — Telephone Encounter (Signed)
Negative COVID results given. Patient results "NOT Detected." Caller expressed understanding. ° °

## 2021-04-29 ENCOUNTER — Other Ambulatory Visit
Admission: RE | Admit: 2021-04-29 | Discharge: 2021-04-29 | Disposition: A | Discharge status: Home / Self Care | Payer: Commercial Managed Care - HMO | Source: Ambulatory Visit | Attending: Internal Medicine | Admitting: Internal Medicine

## 2021-04-29 DIAGNOSIS — I208 Other forms of angina pectoris: Secondary | ICD-10-CM | POA: Insufficient documentation

## 2021-04-29 DIAGNOSIS — Z79899 Other long term (current) drug therapy: Secondary | ICD-10-CM | POA: Diagnosis present

## 2021-04-29 LAB — BRAIN NATRIURETIC PEPTIDE: B Natriuretic Peptide: 5.9 pg/mL (ref 0.0–100.0)

## 2021-04-29 LAB — D-DIMER, QUANTITATIVE: D-Dimer, Quant: <0.27 ug/mL-FEU (ref 0.00–0.50)

## 2022-01-16 ENCOUNTER — Ambulatory Visit
Admission: EM | Admit: 2022-01-16 | Discharge: 2022-01-16 | Disposition: A | Discharge status: Home / Self Care | Payer: Commercial Managed Care - HMO | Attending: Emergency Medicine | Admitting: Emergency Medicine

## 2022-01-16 ENCOUNTER — Other Ambulatory Visit: Payer: Self-pay

## 2022-01-16 DIAGNOSIS — M7918 Myalgia, other site: Secondary | ICD-10-CM | POA: Diagnosis not present

## 2022-01-16 DIAGNOSIS — M542 Cervicalgia: Secondary | ICD-10-CM

## 2022-01-16 MED ORDER — IBUPROFEN 800 MG PO TABS: 800.0000 mg | ORAL_TABLET | Freq: Three times a day (TID) | ORAL | 0 refills | Status: AC

## 2022-01-16 MED ORDER — METHOCARBAMOL 500 MG PO TABS: 500.0000 mg | ORAL_TABLET | Freq: Two times a day (BID) | ORAL | 0 refills | Status: DC

## 2022-01-16 NOTE — Discharge Instructions (Addendum)
Discussed with patient to take muscle relaxer as needed cautious with taking this can cause sleepiness. Take Motrin as needed for pain If symptoms persist after 72 hours to call [facility] for follow-up and possible x-rays If dizziness persist and uncontrolled patient needs to be seen in the emergency room
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
## 2022-10-13 ENCOUNTER — Ambulatory Visit: Payer: Commercial Managed Care - HMO | Admitting: Urology

## 2022-10-13 ENCOUNTER — Encounter: Payer: Self-pay | Admitting: Urology

## 2022-10-13 VITALS — BP 132/89 | HR 76 | Ht 76.0 in | Wt 248.0 lb

## 2022-10-13 DIAGNOSIS — K4090 Unilateral inguinal hernia, without obstruction or gangrene, not specified as recurrent: Secondary | ICD-10-CM | POA: Diagnosis not present

## 2022-10-13 DIAGNOSIS — C61 Malignant neoplasm of prostate: Secondary | ICD-10-CM

## 2022-10-13 DIAGNOSIS — N529 Male erectile dysfunction, unspecified: Secondary | ICD-10-CM

## 2022-10-13 MED ORDER — SILDENAFIL CITRATE 20 MG PO TABS: 20.0000 mg | ORAL_TABLET | Freq: Every day | ORAL | 3 refills | Status: AC | PRN

## 2022-10-13 NOTE — Progress Notes (Signed)
   10/13/22 8:44 AM   Brett Bell 10-04-1960 [id number]  CC: History of prostate cancer, ED  HPI: 62 year old male here to establish care for the above issues after previously being followed by Dr. Eliberto Ivory.  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 Eliberto Ivory with HIFU. PSA has remained undetectable since that time, most recently in December 2023.  He has ED that is well-managed on 20 mg sildenafil on demand.  He denies any urinary symptoms, has nocturia 0-1 time overnight.  He has a history of a sebaceous cyst removal from the scrotum by Dr. Eliberto Ivory as well in 2019.  He also is concerned he could have a right-sided inguinal hernia and would like referral to general surgery.  He had a hernia repair before but he does not remember which side, and that was reportedly performed by Dr. Eliberto Ivory.  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 He has never been exposed to tobacco smoke. He has never used smokeless tobacco. He reports that he does not drink alcohol and does not use drugs.  Physical Exam:   Constitutional:  Alert and oriented, No acute distress. Cardiovascular: No clubbing, cyanosis, or edema. Respiratory: Normal respiratory effort, no increased work of breathing. GI: Abdomen is soft, nontender, nondistended, no abdominal masses   Assessment & Plan:   62 year old male with history of low risk prostate cancer treated with HIFU by Dr. Eliberto Ivory in 2012, PSA has been  undetectable, most recently in December 2023, ED well-controlled on sildenafil.  With his young age he would like to continue to monitor yearly PSA values which I think is reasonable.  Outside records reviewed extensively.  Sildenafil 20 mg as needed refilled Referral placed to general surgery for possible inguinal hernia RTC 1 year PSA prior   Nickolas Madrid, MD 10/13/2022  [facility] [address] [place], [place] [address] [phone]

## 2022-10-13 NOTE — Patient Instructions (Signed)
Your prescription was sent to [email or web address], go to the website and set up a free account.

## 2022-10-14 ENCOUNTER — Ambulatory Visit: Payer: Self-pay | Admitting: Surgery

## 2022-10-14 ENCOUNTER — Ambulatory Visit: Payer: Commercial Managed Care - HMO | Admitting: Surgery

## 2022-10-14 ENCOUNTER — Encounter: Payer: Self-pay | Admitting: Surgery

## 2022-10-14 VITALS — BP 114/76 | HR 71 | Temp 98.0°F | Ht 76.0 in | Wt 249.0 lb

## 2022-10-14 DIAGNOSIS — K4090 Unilateral inguinal hernia, without obstruction or gangrene, not specified as recurrent: Secondary | ICD-10-CM | POA: Diagnosis not present

## 2022-10-14 HISTORY — DX: Unilateral inguinal hernia, without obstruction or gangrene, not specified as recurrent: K40.90

## 2022-10-14 NOTE — H&P (View-Only) (Signed)
Patient ID: [facility], male   DOB: 09-02-1960, 62 y.o.   MRN: [id number]  Chief Complaint: Right inguinal hernia  History of Present Illness [facility] is a 62 y.o. male with a prior history of left inguinal hernia repair, currently without any symptoms on the left.  Notes significant bulging on the right side approximately a year ago.  It spontaneously reduces.  He has more tenderness late in the day into the evening.  Denies any exacerbation with coughing sneezing.  Denies any constipation history.  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     atorvastatin (LIPITOR) 20 MG tablet Take 20 mg by mouth every evening.  0   fluticasone (FLONASE) 50 MCG/ACT nasal spray Place 2 sprays into both nostrils at bedtime.     ibuprofen (ADVIL) 800 MG tablet Take 1 tablet (800 mg total) by mouth 3 (three) times daily. 21 tablet 0   metoprolol succinate (TOPROL-XL) 50 MG 24 hr tablet Take 25 mg by mouth at bedtime. Take with or immediately following a meal.      Omega-3 Fatty Acids (FISH OIL) 1000 MG CAPS Take 1,000 mg by mouth daily.     sildenafil (REVATIO) 20 MG tablet Take 1 tablet (20 mg total) by mouth daily as needed (prior to sexual activity). 90 tablet 3   vitamin C (ASCORBIC ACID) 500 MG tablet Take 500 mg by mouth daily.     No current facility-administered medications for this visit.    Family History No family history on  file.    Social History Social History   Tobacco Use   Smoking status: Never    Passive exposure: Never   Smokeless tobacco: Never  Vaping Use   Vaping Use: Never used  Substance Use Topics   Alcohol use: Never   Drug use: Never        Review of Systems  Constitutional: Negative.   HENT: Negative.    Eyes: Negative.   Respiratory: Negative.    Cardiovascular: Negative.   Gastrointestinal: Negative.   Genitourinary: Negative.   Skin: Negative.   Neurological: Negative.   Psychiatric/Behavioral: Negative.       Physical Exam Blood pressure 114/76, pulse 71, temperature 98 F (36.7 C), height '6\' 4"'$  (1.93 m), weight 249 lb (112.9 kg), SpO2 98 %. Last Weight  Most recent update: 10/14/2022  3:17 PM    Weight  112.9 kg (249 lb)             CONSTITUTIONAL: Well developed, and nourished, appropriately responsive and aware without distress.   EYES: Sclera non-icteric.   EARS, NOSE, MOUTH AND THROAT:  The oropharynx is clear. Oral mucosa is pink and moist.    Hearing is intact to voice.  NECK: Trachea is midline, and there is no jugular venous distension.  LYMPH NODES:  Lymph nodes in the neck are not appreciated. RESPIRATORY:  Lungs are clear, and breath sounds are equal bilaterally.  Normal respiratory effort without pathologic use of accessory muscles. CARDIOVASCULAR: Heart is regular in rate and rhythm.   Well perfused.  GI: The abdomen is soft, nontender, and nondistended. There were no palpable masses.  I did not appreciate hepatosplenomegaly.  GU: Left groin scar, descended testicles.  Right groin mass/bulge minimally mobile with Valsalva.  Not grossly tender. MUSCULOSKELETAL:  Symmetrical muscle tone appreciated in all four extremities.    SKIN: Skin turgor is normal. No pathologic skin lesions appreciated.  NEUROLOGIC:  Motor and sensation appear grossly normal.  Cranial nerves are grossly without defect. PSYCH:  Alert and oriented to person, place and time.  Affect is appropriate for situation.  Data Reviewed I have personally reviewed what is currently available of the patient's imaging, recent labs and medical records.   Labs:      No data to display             No data to display            Imaging: Radiological images reviewed:   Within last 24 hrs: No results found.  Assessment    Right inguinal hernia There are no problems to display for this patient.   Plan    Robotic repair of right inguinal hernia  I discussed possibility of incarceration, strangulation, enlargement in size over time, and the need for emergency surgery in the face of these.  Also reviewed the techniques of reduction should incarceration occur, and when unsuccessful to present to the ED.  Also discussed that surgery risks include recurrence which can be up to 30% in the case of complex hernias, use of prosthetic materials (mesh) and the increased risk of infection and the possible need for re-operation and removal of mesh, possibility of post-op SBO or ileus, and the risks of general anesthetic including heart attack, stroke, sudden death or some reaction to anesthetic medications. The patient, and those present, appear to understand the risks, any and all questions were answered to the patient's satisfaction.  No guarantees were ever expressed or implied.   Face-to-face time spent with the patient and accompanying care providers(if present) was 30 minutes, with more than 50% of the time spent counseling, educating, and coordinating care of the patient.    These notes generated with voice recognition software. I apologize for typographical errors.  Ronny Bacon M.D., FACS 10/14/2022, 3:38 PM

## 2022-10-14 NOTE — Progress Notes (Signed)
Patient ID: [facility], male   DOB: 04-03-1961, 62 y.o.   MRN: [id number]  Chief Complaint: Right inguinal hernia  History of Present Illness [facility] is a 62 y.o. male with a prior history of left inguinal hernia repair, currently without any symptoms on the left.  Notes significant bulging on the right side approximately a year ago.  It spontaneously reduces.  He has more tenderness late in the day into the evening.  Denies any exacerbation with coughing sneezing.  Denies any constipation history.  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     atorvastatin (LIPITOR) 20 MG tablet Take 20 mg by mouth every evening.  0   fluticasone (FLONASE) 50 MCG/ACT nasal spray Place 2 sprays into both nostrils at bedtime.     ibuprofen (ADVIL) 800 MG tablet Take 1 tablet (800 mg total) by mouth 3 (three) times daily. 21 tablet 0   metoprolol succinate (TOPROL-XL) 50 MG 24 hr tablet Take 25 mg by mouth at bedtime. Take with or immediately following a meal.      Omega-3 Fatty Acids (FISH OIL) 1000 MG CAPS Take 1,000 mg by mouth daily.     sildenafil (REVATIO) 20 MG tablet Take 1 tablet (20 mg total) by mouth daily as needed (prior to sexual activity). 90 tablet 3   vitamin C (ASCORBIC ACID) 500 MG tablet Take 500 mg by mouth daily.     No current facility-administered medications for this visit.    Family History No family history on  file.    Social History Social History   Tobacco Use   Smoking status: Never    Passive exposure: Never   Smokeless tobacco: Never  Vaping Use   Vaping Use: Never used  Substance Use Topics   Alcohol use: Never   Drug use: Never        Review of Systems  Constitutional: Negative.   HENT: Negative.    Eyes: Negative.   Respiratory: Negative.    Cardiovascular: Negative.   Gastrointestinal: Negative.   Genitourinary: Negative.   Skin: Negative.   Neurological: Negative.   Psychiatric/Behavioral: Negative.       Physical Exam Blood pressure 114/76, pulse 71, temperature 98 F (36.7 C), height 6' 4"$  (1.93 m), weight 249 lb (112.9 kg), SpO2 98 %. Last Weight  Most recent update: 10/14/2022  3:17 PM    Weight  112.9 kg (249 lb)             CONSTITUTIONAL: Well developed, and nourished, appropriately responsive and aware without distress.   EYES: Sclera non-icteric.   EARS, NOSE, MOUTH AND THROAT:  The oropharynx is clear. Oral mucosa is pink and moist.    Hearing is intact to voice.  NECK: Trachea is midline, and there is no jugular venous distension.  LYMPH NODES:  Lymph nodes in the neck are not appreciated. RESPIRATORY:  Lungs are clear, and breath sounds are equal bilaterally.  Normal respiratory effort without pathologic use of accessory muscles. CARDIOVASCULAR: Heart is regular in rate and rhythm.   Well perfused.  GI: The abdomen is soft, nontender, and nondistended. There were no palpable masses.  I did not appreciate hepatosplenomegaly.  GU: Left groin scar, descended testicles.  Right groin mass/bulge minimally mobile with Valsalva.  Not grossly tender. MUSCULOSKELETAL:  Symmetrical muscle tone appreciated in all four extremities.    SKIN: Skin turgor is normal. No pathologic skin lesions appreciated.  NEUROLOGIC:  Motor and sensation appear grossly normal.  Cranial nerves are grossly without defect. PSYCH:  Alert and oriented to person, place and time.  Affect is appropriate for situation.  Data Reviewed I have personally reviewed what is currently available of the patient's imaging, recent labs and medical records.   Labs:      No data to display             No data to display            Imaging: Radiological images reviewed:   Within last 24 hrs: No results found.  Assessment    Right inguinal hernia There are no problems to display for this patient.   Plan    Robotic repair of right inguinal hernia  I discussed possibility of incarceration, strangulation, enlargement in size over time, and the need for emergency surgery in the face of these.  Also reviewed the techniques of reduction should incarceration occur, and when unsuccessful to present to the ED.  Also discussed that surgery risks include recurrence which can be up to 30% in the case of complex hernias, use of prosthetic materials (mesh) and the increased risk of infection and the possible need for re-operation and removal of mesh, possibility of post-op SBO or ileus, and the risks of general anesthetic including heart attack, stroke, sudden death or some reaction to anesthetic medications. The patient, and those present, appear to understand the risks, any and all questions were answered to the patient's satisfaction.  No guarantees were ever expressed or implied.   Face-to-face time spent with the patient and accompanying care providers(if present) was 30 minutes, with more than 50% of the time spent counseling, educating, and coordinating care of the patient.    These notes generated with voice recognition software. I apologize for typographical errors.  Ronny Bacon M.D., FACS 10/14/2022, 3:38 PM

## 2022-10-14 NOTE — Patient Instructions (Addendum)
You have chose to have your hernia repaired. This will be done by Dr. Christian Mate at [facility].  Please see your (blue) Pre-care information that you have been given today. Our surgery scheduler will call you to verify surgery date and to go over information.   You will need to arrange to be out of work for approximately 1-2 weeks and then you may return with a lifting restriction for 4 more weeks. If you have FMLA or Disability paperwork that needs to be filled out, please have your company fax your paperwork to [phone] or you may drop this by either office. This paperwork will be filled out within 3 days after your surgery has been completed.  You may have a bruise in your groin and also swelling and brusing in your testicle area. You may use ice 4-5 times daily for 15-20 minutes each time. Make sure that you place a barrier between you and the ice pack. To decrease the swelling, you may roll up a bath towel and place it vertically in between your thighs with your testicles resting on the towel. You will want to keep this area elevated as much as possible for several days following surgery.     Inguinal Hernia, Adult Muscles help keep everything in the body in its proper place. But if a weak spot in the muscles develops, something can poke through. That is called a hernia. When this happens in the lower part of the belly (abdomen), it is called an inguinal hernia. (It takes its name from a part of the body in this region called the inguinal canal.) A weak spot in the wall of muscles lets some fat or part of the small intestine bulge through. An inguinal hernia can develop at any age. Men get them more often than women. CAUSES  In adults, an inguinal hernia develops over time. It can be triggered by: Suddenly straining the muscles of the lower abdomen. Lifting heavy objects. Straining to have a bowel movement. Difficult bowel movements (constipation) can lead to this. Constant coughing. This may  be caused by smoking or lung disease. Being overweight. Being pregnant. Working at a job that requires long periods of standing or heavy lifting. Having had an inguinal hernia before. One type can be an emergency situation. It is called a strangulated inguinal hernia. It develops if part of the small intestine slips through the weak spot and cannot get back into the abdomen. The blood supply can be cut off. If that happens, part of the intestine may die. This situation requires emergency surgery. SYMPTOMS  Often, a small inguinal hernia has no symptoms. It is found when a healthcare provider does a physical exam. Larger hernias usually have symptoms.  In adults, symptoms may include: A lump in the groin. This is easier to see when the person is standing. It might disappear when lying down. In men, a lump in the scrotum. Pain or burning in the groin. This occurs especially when lifting, straining or coughing. A dull ache or feeling of pressure in the groin. Signs of a strangulated hernia can include: A bulge in the groin that becomes very painful and tender to the touch. A bulge that turns red or purple. Fever, nausea and vomiting. Inability to have a bowel movement or to pass gas. DIAGNOSIS  To decide if you have an inguinal hernia, a healthcare provider will probably do a physical examination. This will include asking questions about any symptoms you have noticed. The healthcare provider might  feel the groin area and ask you to cough. If an inguinal hernia is felt, the healthcare provider may try to slide it back into the abdomen. Usually no other tests are needed. TREATMENT  Treatments can vary. The size of the hernia makes a difference. Options include: Watchful waiting. This is often suggested if the hernia is small and you have had no symptoms. No medical procedure will be done unless symptoms develop. You will need to watch closely for symptoms. If any occur, contact your healthcare  provider right away. Surgery. This is used if the hernia is larger or you have symptoms. Open surgery. This is usually an outpatient procedure (you will not stay overnight in a hospital). An cut (incision) is made through the skin in the groin. The hernia is put back inside the abdomen. The weak area in the muscles is then repaired by herniorrhaphy or hernioplasty. Herniorrhaphy: in this type of surgery, the weak muscles are sewn back together. Hernioplasty: a patch or mesh is used to close the weak area in the abdominal wall. Laparoscopy. In this procedure, a surgeon makes small incisions. A thin tube with a tiny video camera (called a laparoscope) is put into the abdomen. The surgeon repairs the hernia with mesh by looking with the video camera and using two long instruments. HOME CARE INSTRUCTIONS  After surgery to repair an inguinal hernia: You will need to take pain medicine prescribed by your healthcare provider. Follow all directions carefully. You will need to take care of the wound from the incision. Your activity will be restricted for awhile. This will probably include no heavy lifting for several weeks. You also should not do anything too active for a few weeks. When you can return to work will depend on the type of job that you have. During "watchful waiting" periods, you should: Maintain a healthy weight. Eat a diet high in fiber (fruits, vegetables and whole grains). Drink plenty of fluids to avoid constipation. This means drinking enough water and other liquids to keep your urine clear or pale yellow. Do not lift heavy objects. Do not stand for long periods of time. Quit smoking. This should keep you from developing a frequent cough. SEEK MEDICAL CARE IF:  A bulge develops in your groin area. You feel pain, a burning sensation or pressure in the groin. This might be worse if you are lifting or straining. You develop a fever of more than 100.5 F (38.1 C). SEEK IMMEDIATE MEDICAL  CARE IF:  Pain in the groin increases suddenly. A bulge in the groin gets bigger suddenly and does not go down. For men, there is sudden pain in the scrotum. Or, the size of the scrotum increases. A bulge in the groin area becomes red or purple and is painful to touch. You have nausea or vomiting that does not go away. You feel your heart beating much faster than normal. You cannot have a bowel movement or pass gas. You develop a fever of more than 102.0 F (38.9 C).   This information is not intended to replace advice given to you by your health care provider. Make sure you discuss any questions you have with your health care provider.   Document Released: 01/04/2009 Document Revised: 11/10/2011 Document Reviewed: 02/19/2015 Elsevier Interactive Patient Education [employer].

## 2022-10-15 ENCOUNTER — Telehealth: Payer: Self-pay | Admitting: Surgery

## 2022-10-15 NOTE — Telephone Encounter (Signed)
Left message for patient to call, please inform him of the following regarding scheduled surgery with Dr. Christian Mate.   Pre-Admission date/time, and Surgery date at [facility].  Surgery Date: 10/29/22 Preadmission Testing Date: 10/22/22 (phone 8a-1p)  Also patient will need to call at [phone], between 1-3:00pm the day before surgery, to find out what time to arrive for surgery.

## 2022-10-16 NOTE — Telephone Encounter (Signed)
Called patient again, he is now aware of all dates regarding his surgery.

## 2022-10-22 ENCOUNTER — Encounter
Admission: RE | Admit: 2022-10-22 | Discharge: 2022-10-22 | Disposition: A | Discharge status: Home / Self Care | Payer: Commercial Managed Care - HMO | Source: Ambulatory Visit | Attending: Surgery | Admitting: Surgery

## 2022-10-22 DIAGNOSIS — Z01810 Encounter for preprocedural cardiovascular examination: Secondary | ICD-10-CM

## 2022-10-22 DIAGNOSIS — Z01812 Encounter for preprocedural laboratory examination: Secondary | ICD-10-CM

## 2022-10-22 DIAGNOSIS — I10 Essential (primary) hypertension: Secondary | ICD-10-CM

## 2022-10-22 HISTORY — DX: Angina pectoris, unspecified: I20.9

## 2022-10-22 HISTORY — DX: Male erectile dysfunction, unspecified: N52.9

## 2022-10-22 HISTORY — DX: Obesity, unspecified: E66.9

## 2022-10-22 HISTORY — DX: Sleep apnea, unspecified: G47.30

## 2022-10-22 HISTORY — DX: Bilateral inguinal hernia, without obstruction or gangrene, not specified as recurrent: K40.20

## 2022-10-22 HISTORY — DX: Essential (primary) hypertension: I10

## 2022-10-22 NOTE — Progress Notes (Signed)
Pt out of town and is needing labs and EKG for upcoming surgery-Pt will not be back in town until Friday. Pt will come on 2-26 Monday for labs and ekg

## 2022-10-22 NOTE — Patient Instructions (Addendum)
Your procedure is scheduled on:10-29-22 Wednesday Report to the Registration Desk on the 1st floor of the [facility].Then proceed to the 2nd floor Surgery Desk To find out your arrival time, please call [phone] between 1PM - 3PM on:10-28-22 Tuesday If your arrival time is 6:00 am, do not arrive before that time as the [facility] entrance doors do not open until 6:00 am.  REMEMBER: Instructions that are not followed completely may result in serious medical risk, up to and including death; or upon the discretion of your surgeon and anesthesiologist your surgery may need to be rescheduled.  Do not eat food OR drink liquids after midnight the night before surgery.  No gum chewing or hard candies.  One week prior to surgery: Stop Anti-inflammatories (NSAIDS) such as Advil, Aleve, Ibuprofen, Motrin, Naproxen, Naprosyn and Aspirin based products such as Excedrin, Goody's Powder, BC Powder.You may however, continue to take Tylenol if needed for pain up until the day of surgery.  Stop ANY OVER THE COUNTER supplements/vitamins NOW (10-22-22) until after surgery (Fish Oil and Vitamin C)    Stop your 81 mg Aspirin as instructed by Dr Christian Mate  TAKE ONLY THESE MEDICATIONS THE MORNING OF SURGERY WITH A SIP OF WATER: -loratadine (CLARITIN)   No Alcohol for 24 hours before or after surgery.  No Smoking including e-cigarettes for 24 hours before surgery.  No chewable tobacco products for at least 6 hours before surgery.  No nicotine patches on the day of surgery.  Do not use any "recreational" drugs for at least a week (preferably 2 weeks) before your surgery.  Please be advised that the combination of cocaine and anesthesia may have negative outcomes, up to and including death. If you test positive for cocaine, your surgery will be cancelled.  On the morning of surgery brush your teeth with toothpaste and water, you may rinse your mouth with mouthwash if you wish. Do not swallow any  toothpaste or mouthwash.  Use CHG Soap as directed on instruction sheet.  Do not wear jewelry, make-up, hairpins, clips or nail polish.  Do not wear lotions, powders, or perfumes.   Do not shave body hair from the neck down 48 hours before surgery.  Contact lenses, hearing aids and dentures may not be worn into surgery.  Do not bring valuables to the hospital. The [facility] is not responsible for any missing/lost belongings or valuables.   Notify your doctor if there is any change in your medical condition (cold, fever, infection).  Wear comfortable clothing (specific to your surgery type) to the hospital.  After surgery, you can help prevent lung complications by doing breathing exercises.  Take deep breaths and cough every 1-2 hours. Your doctor may order a device called an Incentive Spirometer to help you take deep breaths. When coughing or sneezing, hold a pillow firmly against your incision with both hands. This is called "splinting." Doing this helps protect your incision. It also decreases belly discomfort.  If you are being admitted to the hospital overnight, leave your suitcase in the car. After surgery it may be brought to your room.  In case of increased patient census, it may be necessary for you, the patient, to continue your postoperative care in the Same Day Surgery department.  If you are being discharged the day of surgery, you will not be allowed to drive home. You will need a responsible individual to drive you home and stay with you for 24 hours after surgery.   If you are taking  public transportation, you will need to have a responsible individual with you.  Please call the [facility] Dept. at [phone] if you have any questions about these instructions.  Surgery Visitation Policy:  Patients undergoing a surgery or procedure may have two family members or support persons with them as long as the person is not COVID-19 positive or experiencing its  symptoms.   Due to an increase in RSV and influenza rates and associated hospitalizations, children ages 20 and under will not be able to visit patients in [facility]. Masks continue to be strongly recommended.     Preparing for Surgery with CHLORHEXIDINE GLUCONATE (CHG) Soap  Chlorhexidine Gluconate (CHG) Soap  o An antiseptic cleaner that kills germs and bonds with the skin to continue killing germs even after washing  o Used for showering the night before surgery and morning of surgery  Before surgery, you can play an important role by reducing the number of germs on your skin.  CHG (Chlorhexidine gluconate) soap is an antiseptic cleanser which kills germs and bonds with the skin to continue killing germs even after washing.  Please do not use if you have an allergy to CHG or antibacterial soaps. If your skin becomes reddened/irritated stop using the CHG.  1. Shower the NIGHT BEFORE SURGERY and the MORNING OF SURGERY with CHG soap.  2. If you choose to wash your hair, wash your hair first as usual with your normal shampoo.  3. After shampooing, rinse your hair and body thoroughly to remove the shampoo.  4. Use CHG as you would any other liquid soap. You can apply CHG directly to the skin and wash gently with a scrungie or a clean washcloth.  5. Apply the CHG soap to your body only from the neck down. Do not use on open wounds or open sores. Avoid contact with your eyes, ears, mouth, and genitals (private parts). Wash face and genitals (private parts) with your normal soap.  6. Wash thoroughly, paying special attention to the area where your surgery will be performed.  7. Thoroughly rinse your body with warm water.  8. Do not shower/wash with your normal soap after using and rinsing off the CHG soap.  9. Pat yourself dry with a clean towel.  10. Wear clean pajamas to bed the night before surgery.  12. Place clean sheets on your bed the night of your first shower  and do not sleep with pets.  13. Shower again with the CHG soap on the day of surgery prior to arriving at the hospital.  14. Do not apply any deodorants/lotions/powders.  15. Please wear clean clothes to the hospital.

## 2022-10-27 ENCOUNTER — Encounter
Admission: RE | Admit: 2022-10-27 | Discharge: 2022-10-27 | Disposition: A | Discharge status: Home / Self Care | Payer: Commercial Managed Care - HMO | Source: Ambulatory Visit | Attending: Surgery | Admitting: Surgery

## 2022-10-27 ENCOUNTER — Encounter: Payer: Self-pay | Admitting: Urgent Care

## 2022-10-27 DIAGNOSIS — K4090 Unilateral inguinal hernia, without obstruction or gangrene, not specified as recurrent: Secondary | ICD-10-CM | POA: Insufficient documentation

## 2022-10-27 DIAGNOSIS — Z01810 Encounter for preprocedural cardiovascular examination: Secondary | ICD-10-CM

## 2022-10-27 DIAGNOSIS — Z01812 Encounter for preprocedural laboratory examination: Secondary | ICD-10-CM | POA: Insufficient documentation

## 2022-10-27 DIAGNOSIS — I10 Essential (primary) hypertension: Secondary | ICD-10-CM

## 2022-10-27 LAB — COMPREHENSIVE METABOLIC PANEL
ALT: 20 U/L (ref 0–44)
AST: 20 U/L (ref 15–41)
Albumin: 3.8 g/dL (ref 3.5–5.0)
Alkaline Phosphatase: 72 U/L (ref 38–126)
Anion gap: 6 (ref 5–15)
BUN: 15 mg/dL (ref 6–20)
CO2: 29 mmol/L (ref 22–32)
Calcium: 9.1 mg/dL (ref 8.9–10.3)
Chloride: 105 mmol/L (ref 98–111)
Creatinine, Ser: 1.06 mg/dL (ref 0.61–1.24)
GFR, Estimated: >60 mL/min (ref >60)
Glucose, Bld: 81 mg/dL (ref 70–99)
Potassium: 3.9 mmol/L (ref 3.5–5.1)
Sodium: 140 mmol/L (ref 135–145)
Total Bilirubin: 0.6 mg/dL (ref 0.3–1.2)
Total Protein: 6.8 g/dL (ref 6.5–8.1)

## 2022-10-27 LAB — CBC WITH DIFFERENTIAL/PLATELET
Abs Immature Granulocytes: 0.01 10*3/uL (ref 0.00–0.07)
Basophils Absolute: 0 10*3/uL (ref 0.0–0.1)
Basophils Relative: 1 %
Eosinophils Absolute: 0 10*3/uL (ref 0.0–0.5)
Eosinophils Relative: 1 %
HCT: 44.4 % (ref 39.0–52.0)
Hemoglobin: 15 g/dL (ref 13.0–17.0)
Immature Granulocytes: 0 %
Lymphocytes Relative: 35 %
Lymphs Abs: 1.8 10*3/uL (ref 0.7–4.0)
MCH: 32.3 pg (ref 26.0–34.0)
MCHC: 33.8 g/dL (ref 30.0–36.0)
MCV: 95.5 fL (ref 80.0–100.0)
Monocytes Absolute: 0.6 10*3/uL (ref 0.1–1.0)
Monocytes Relative: 11 %
Neutro Abs: 2.7 10*3/uL (ref 1.7–7.7)
Neutrophils Relative %: 52 %
Platelets: 158 10*3/uL (ref 150–400)
RBC: 4.65 MIL/uL (ref 4.22–5.81)
RDW: 12 % (ref 11.5–15.5)
WBC: 5.2 10*3/uL (ref 4.0–10.5)
nRBC: 0 % (ref 0.0–0.2)

## 2022-10-27 NOTE — Progress Notes (Signed)
Cardiology clearance has been received from Dr Clayborn Bigness. The patient is cleared at Moderate risk for surgery.

## 2022-10-28 ENCOUNTER — Encounter: Payer: Self-pay | Admitting: Surgery

## 2022-10-28 MED ORDER — CEFAZOLIN SODIUM-DEXTROSE 2-4 GM/100ML-% IV SOLN
2.0000 g | INTRAVENOUS | Status: AC
  Administered 2022-10-29: 2 g via INTRAVENOUS

## 2022-10-28 MED ORDER — BUPIVACAINE LIPOSOME 1.3 % IJ SUSP: 20.0000 mL | Freq: Once | INTRAMUSCULAR | Status: DC

## 2022-10-28 MED ORDER — LACTATED RINGERS IV SOLN: INTRAVENOUS | Status: DC

## 2022-10-28 MED ORDER — FAMOTIDINE 20 MG PO TABS: 20.0000 mg | ORAL_TABLET | Freq: Once | ORAL | Status: AC

## 2022-10-28 MED ORDER — CELECOXIB 200 MG PO CAPS: 200.0000 mg | ORAL_CAPSULE | ORAL | Status: AC

## 2022-10-28 MED ORDER — CHLORHEXIDINE GLUCONATE CLOTH 2 % EX PADS
6.0000 | MEDICATED_PAD | Freq: Once | CUTANEOUS | Status: AC
  Administered 2022-10-29: 6 via TOPICAL

## 2022-10-28 MED ORDER — CHLORHEXIDINE GLUCONATE 0.12 % MT SOLN: 15.0000 mL | Freq: Once | OROMUCOSAL | Status: AC

## 2022-10-28 MED ORDER — ACETAMINOPHEN 500 MG PO TABS: 1000.0000 mg | ORAL_TABLET | ORAL | Status: AC

## 2022-10-28 MED ORDER — ORAL CARE MOUTH RINSE: 15.0000 mL | Freq: Once | OROMUCOSAL | Status: AC

## 2022-10-28 MED ORDER — GABAPENTIN 300 MG PO CAPS: 300.0000 mg | ORAL_CAPSULE | ORAL | Status: AC

## 2022-10-28 NOTE — Progress Notes (Signed)
Perioperative / Anesthesia Services  Pre-Admission Testing Clinical Review / Preoperative Anesthesia Consult  Date: 10/28/22  Patient Demographics:  Name: Brett Bell DOB:   1961/02/15 MRN:   [id number]  Planned Surgical Procedure(s):    Case: [id number] Date/Time: 10/29/22 0715   Procedure: XI ROBOTIC ASSISTED INGUINAL HERNIA (Right)   Anesthesia type: General   Pre-op diagnosis: inguinal hernia   Location: [facility] OR ROOM 06 / [facility] ORS FOR ANESTHESIA GROUP   Surgeons: Ronny Bacon, MD     NOTE: Available PAT nursing documentation and vital signs have been reviewed. Clinical nursing staff has updated patient's PMH/PSHx, current medication list, and drug allergies/intolerances to ensure comprehensive history available to assist in medical decision making as it pertains to the aforementioned surgical procedure and anticipated anesthetic course. Extensive review of available clinical information personally performed. [facility] PMH and PSHx updated with any diagnoses/procedures that  may have been inadvertently omitted during his intake with the pre-admission testing department's nursing staff.  Clinical Discussion:  Brett Bell is a 62 y.o. male who is submitted for pre-surgical anesthesia review and clearance prior to him undergoing the above procedure. Patient has never been a smoker. Pertinent PMH includes: CAD, angina, bradycardia, palpitations, HTN, HLD, OSAH (no nocturnal PAP therapy), GERD (no daily Tx), childhood anemia, erectile dysfunction (on PDE5i), BILATERAL inguinal hernia, remote prostate cancer.  Patient is followed by cardiology Clayborn Bigness, MD). He was last seen in the cardiology clinic on 10/27/2022; notes reviewed. At the time of his clinic visit, patient doing well overall from a cardiovascular perspective.  Patient reporting pain "under his ribs" associated with normal respiration.  Patient denied any specific chest pain, shortness of breath, PND, orthopnea, palpitations,  significant peripheral edema, weakness, fatigue, vertiginous symptoms, or presyncope/syncope. Patient with a past medical history significant for cardiovascular diagnoses. Documented physical exam was grossly benign, providing no evidence of acute exacerbation and/or decompensation of the patient's known cardiovascular conditions.  Patient underwent diagnostic LEFT heart catheterization on 03/16/2012 revealing moderate luminal irregularities within the mid LCx and proximal RCA territories.  There was 40% stenosis noted in the mid LAD.  Given the nonobstructive nature of his coronary artery disease, the decision was made to defer intervention opting for medical management.  Blood pressure well controlled at 124/72 mmHg on currently prescribed beta-blocker (metoprolol succinate) monotherapy.  Patient is on atorvastatin + omega-3 fatty acid for his HLD diagnosis and ASCVD prevention. Patient is not diabetic.  Of note, in the setting of known cardiovascular diagnoses, it is important to mention that patient is on a PDE5i (sildenafil) for and erectile dysfunction diagnosis.  Patient does have an OSAH diagnosis, however he is not currently utilizing nocturnal PAP therapy.  Functional capacity, as defined by DASI, is documented as being >/= 4 METS. No changes were made to his medication regimen during his visit with cardiology.  Patient scheduled to follow-up with outpatient cardiology in 1 year or sooner if needed.  Brett Bell is scheduled for an elective XI ROBOTIC ASSISTED INGUINAL HERNIA (Right) on 11/27/2022 with Dr. Ronny Bacon, MD.  Given patient's past medical history significant for cardiovascular diagnoses, presurgical cardiac clearance was sought by the PAT team. Per cardiology, "this patient is optimized for surgery and may proceed with the planned procedural course with a MODERATE risk of significant perioperative cardiovascular complications"..    In review of his medication reconciliation, it is  noted that patient is currently on prescribed daily antiplatelet therapy. He has been instructed on recommendations for holding his  daily low-dose ASA for 5 days prior to his procedure with plans to restart as soon as postoperative bleeding risk felt to be minimized by his attending surgeon. The patient has been instructed that his last dose of his ASA should be on 10/23/2022.  Patient denies previous perioperative complications with anesthesia in the past. In review of the available records, it is noted that patient underwent a general anesthetic course here at [facility] in 07/2018 without documented complications.      10/14/2022    3:17 PM 10/13/2022    8:47 AM 01/16/2022    9:49 AM  Vitals with BMI  Height '6\' 4"'$  '6\' 4"'$    Weight 249 lbs 248 lbs   BMI 123XX123 [id number]   Systolic [id number]  Diastolic 76 89 92  Pulse 71 76 53    Providers/Specialists:   NOTE: Primary physician provider listed below. Patient may have been seen by APP or partner within same practice.   PROVIDER ROLE / SPECIALTY LAST Katy Apo, MD General Surgery (Surgeon) 10/14/2022  Dion Body, MD Primary Care Provider 09/12/2022  Katrine Coho, MD Cardiology 10/27/2022   Allergies:  Patient has no known allergies.  Current Home Medications:   No current facility-administered medications for this encounter.    aspirin EC 81 MG tablet   atorvastatin (LIPITOR) 20 MG tablet   fluticasone (FLONASE) 50 MCG/ACT nasal spray   ibuprofen (ADVIL) 800 MG tablet   loratadine (CLARITIN) 10 MG tablet   metoprolol succinate (TOPROL-XL) 50 MG 24 hr tablet   Omega-3 Fatty Acids (FISH OIL) 1000 MG CAPS   sildenafil (REVATIO) 20 MG tablet   vitamin C (ASCORBIC ACID) 500 MG tablet   History:   Past Medical History:  Diagnosis Date   Anemia    ONLY AS A CHILD   Anginal pain (HCC)    Bradycardia    CAD (coronary artery disease) 03/16/2012   a.) LHC 03/16/2012:  40% mLAD, minor luminal irregs in mLCx and pRCA - med mgmt.   ED (erectile dysfunction)    a.) on PDE5i (sildenafil)   GERD (gastroesophageal reflux disease)    Hyperlipidemia    Hypertension    Inguinal hernia, bilateral    Obesity    Palpitations    Prostate cancer ([place]) 2012   a.) s/p prostatectomy   Sleep apnea    a.) does not require nocturnal PAP therapy   Past Surgical History:  Procedure Laterality Date   COLONOSCOPY  2018   INGUINAL HERNIA REPAIR     LEFT HEART CATH AND CORONARY ANGIOGRAPHY Left 03/16/2012   Procedure: LEFT HEART CATH AND CORONARY ANGIOGRAPHY; Location: [facility]; Surgeon: Neoma Laming, MD   PROSTATECTOMY  2012   SCROTAL EXPLORATION N/A 07/27/2018   Procedure: SCROTUM EXPLORATION;  Surgeon: Royston Cowper, MD;  Location: [facility] ORS;  Service: Urology;  Laterality: N/A;   No family history on file. Social History   Tobacco Use   Smoking status: Never    Passive exposure: Never   Smokeless tobacco: Never  Vaping Use   Vaping Use: Never used  Substance Use Topics   Alcohol use: Yes    Comment: wine occ   Drug use: Never    Pertinent Clinical Results:  LABS:   Hospital Outpatient Visit on 10/27/2022  Component Date Value Ref Range Status   Sodium 10/27/2022 140  135 - 145 mmol/L Final   Potassium 10/27/2022 3.9  3.5 - 5.1 mmol/L Final  Chloride 10/27/2022 105  98 - 111 mmol/L Final   CO2 10/27/2022 29  22 - 32 mmol/L Final   Glucose, Bld 10/27/2022 81  70 - 99 mg/dL Final   Glucose reference range applies only to samples taken after fasting for at least 8 hours.   BUN 10/27/2022 15  6 - 20 mg/dL Final   Creatinine, Ser 10/27/2022 1.06  0.61 - 1.24 mg/dL Final   Calcium 10/27/2022 9.1  8.9 - 10.3 mg/dL Final   Total Protein 10/27/2022 6.8  6.5 - 8.1 g/dL Final   Albumin 10/27/2022 3.8  3.5 - 5.0 g/dL Final   AST 10/27/2022 20  15 - 41 U/L Final   ALT 10/27/2022 20  0 - 44 U/L Final   Alkaline Phosphatase 10/27/2022 72  38 - 126 U/L Final    Total Bilirubin 10/27/2022 0.6  0.3 - 1.2 mg/dL Final   GFR, Estimated 10/27/2022 >60  >60 mL/min Final   Comment: (NOTE) Calculated using the CKD-EPI Creatinine Equation (2021)    Anion gap 10/27/2022 6  5 - 15 Final   Performed at [facility], [address] [place], [place] [address]   WBC 10/27/2022 5.2  4.0 - 10.5 K/uL Final   RBC 10/27/2022 4.65  4.22 - 5.81 MIL/uL Final   Hemoglobin 10/27/2022 15.0  13.0 - 17.0 g/dL Final   HCT 10/27/2022 44.4  39.0 - 52.0 % Final   MCV 10/27/2022 95.5  80.0 - 100.0 fL Final   MCH 10/27/2022 32.3  26.0 - 34.0 pg Final   MCHC 10/27/2022 33.8  30.0 - 36.0 g/dL Final   RDW 10/27/2022 12.0  11.5 - 15.5 % Final   Platelets 10/27/2022 158  150 - 400 K/uL Final   nRBC 10/27/2022 0.0  0.0 - 0.2 % Final   Neutrophils Relative % 10/27/2022 52  % Final   Neutro Abs 10/27/2022 2.7  1.7 - 7.7 K/uL Final   Lymphocytes Relative 10/27/2022 35  % Final   Lymphs Abs 10/27/2022 1.8  0.7 - 4.0 K/uL Final   Monocytes Relative 10/27/2022 11  % Final   Monocytes Absolute 10/27/2022 0.6  0.1 - 1.0 K/uL Final   Eosinophils Relative 10/27/2022 1  % Final   Eosinophils Absolute 10/27/2022 0.0  0.0 - 0.5 K/uL Final   Basophils Relative 10/27/2022 1  % Final   Basophils Absolute 10/27/2022 0.0  0.0 - 0.1 K/uL Final   Immature Granulocytes 10/27/2022 0  % Final   Abs Immature Granulocytes 10/27/2022 0.01  0.00 - 0.07 K/uL Final   Performed at [facility], [address] [place], [place] [address]    ECG: -No recent tracing for review. Patient refused repeat in PAT citing that he just had ECG completed by cardiologist. Tracing requested by both fax and phone from cardiology. Will plan on sending to [facility] once received. If, for whatever reason, tracing not received prior to patient's procedure time, he will need to have an ECG performed here prior to proceeding.    IMAGING / PROCEDURES: -None available   Impression and Plan:  Brett Bell has been  referred for pre-anesthesia review and clearance prior to him undergoing the planned anesthetic and procedural courses. Available labs, pertinent testing, and imaging results were personally reviewed by me in preparation for upcoming operative/procedural course. [facility] medical record has been updated following extensive record review and patient interview with PAT staff.   This patient has been appropriately cleared by cardiology with an overall  MODERATE risk of significant perioperative cardiovascular complications. Still need to review preoperative ECG tracing; has been requested. If not received prior to case date/time, patient understands that he will need to have one done here. Based on clinical review performed today (10/28/22), barring any significant acute changes in the patient's overall condition, it is anticipated that he will be able to proceed with the planned surgical intervention. Any acute changes in clinical condition may necessitate his procedure being postponed and/or cancelled. Patient will meet with anesthesia team (MD and/or CRNA) on the day of his procedure for preoperative evaluation/assessment. Questions regarding anesthetic course will be fielded at that time.   Pre-surgical instructions were reviewed with the patient during his PAT appointment, and questions were fielded to satisfaction by PAT clinical staff. He has been instructed on which medications that he will need to hold prior to surgery, as well as the ones that have been deemed safe/appropriate to take of the day of his procedure. As part of the general education provided by PAT, patient made aware both verbally and in writing, that he would need to abstain from the use of any illegal substances during his perioperative course.  He was advised that failure to follow the provided instructions could necessitate case cancellation or result serious perioperative complications up to and including death. Patient encouraged to  contact PAT and/or his surgeon's office to discuss any questions or concerns that may arise prior to surgery; verbalized understanding.   Honor Loh, MSN, APRN, FNP-C, CEN [facility]  Peri-operative Services Nurse Practitioner Phone: [phone] Fax: [phone] 10/28/22 2:34 PM  NOTE: This note has been prepared using Dragon dictation software. Despite my best ability to proofread, there is always the potential that unintentional transcriptional errors may still occur from this process.

## 2022-10-29 ENCOUNTER — Ambulatory Visit: Payer: Commercial Managed Care - HMO | Admitting: Urgent Care

## 2022-10-29 ENCOUNTER — Encounter: Payer: Self-pay | Admitting: Surgery

## 2022-10-29 ENCOUNTER — Ambulatory Visit
Admission: RE | Admit: 2022-10-29 | Discharge: 2022-10-29 | Disposition: A | Discharge status: Home / Self Care | Payer: Commercial Managed Care - HMO | Attending: Surgery | Admitting: Surgery

## 2022-10-29 ENCOUNTER — Other Ambulatory Visit: Payer: Self-pay

## 2022-10-29 ENCOUNTER — Encounter
Admission: RE | Disposition: A | Discharge status: Home / Self Care | Payer: Self-pay | Source: Home / Self Care | Attending: Surgery

## 2022-10-29 DIAGNOSIS — I25119 Atherosclerotic heart disease of native coronary artery with unspecified angina pectoris: Secondary | ICD-10-CM | POA: Insufficient documentation

## 2022-10-29 DIAGNOSIS — K4090 Unilateral inguinal hernia, without obstruction or gangrene, not specified as recurrent: Secondary | ICD-10-CM | POA: Diagnosis not present

## 2022-10-29 DIAGNOSIS — Z7982 Long term (current) use of aspirin: Secondary | ICD-10-CM | POA: Insufficient documentation

## 2022-10-29 DIAGNOSIS — E669 Obesity, unspecified: Secondary | ICD-10-CM | POA: Insufficient documentation

## 2022-10-29 DIAGNOSIS — Z8546 Personal history of malignant neoplasm of prostate: Secondary | ICD-10-CM | POA: Diagnosis not present

## 2022-10-29 DIAGNOSIS — K219 Gastro-esophageal reflux disease without esophagitis: Secondary | ICD-10-CM | POA: Diagnosis not present

## 2022-10-29 DIAGNOSIS — E785 Hyperlipidemia, unspecified: Secondary | ICD-10-CM | POA: Diagnosis not present

## 2022-10-29 DIAGNOSIS — Z79899 Other long term (current) drug therapy: Secondary | ICD-10-CM | POA: Diagnosis not present

## 2022-10-29 DIAGNOSIS — N529 Male erectile dysfunction, unspecified: Secondary | ICD-10-CM | POA: Insufficient documentation

## 2022-10-29 DIAGNOSIS — I10 Essential (primary) hypertension: Secondary | ICD-10-CM | POA: Insufficient documentation

## 2022-10-29 DIAGNOSIS — Z6830 Body mass index (BMI) 30.0-30.9, adult: Secondary | ICD-10-CM | POA: Insufficient documentation

## 2022-10-29 DIAGNOSIS — I499 Cardiac arrhythmia, unspecified: Secondary | ICD-10-CM | POA: Insufficient documentation

## 2022-10-29 DIAGNOSIS — G4733 Obstructive sleep apnea (adult) (pediatric): Secondary | ICD-10-CM | POA: Insufficient documentation

## 2022-10-29 HISTORY — PX: INSERTION OF MESH: SHX5868

## 2022-10-29 HISTORY — DX: Bradycardia, unspecified: R00.1

## 2022-10-29 HISTORY — DX: Gastro-esophageal reflux disease without esophagitis: K21.9

## 2022-10-29 HISTORY — DX: Palpitations: R00.2

## 2022-10-29 SURGERY — HERNIORRHAPHY, INGUINAL, ROBOT-ASSISTED, LAPAROSCOPIC
Anesthesia: General | Laterality: Right

## 2022-10-29 MED ORDER — CELECOXIB 200 MG PO CAPS
ORAL_CAPSULE | ORAL | Status: AC
  Administered 2022-10-29: 200 mg via ORAL
  Filled 2022-10-29: qty 1

## 2022-10-29 MED ORDER — OXYCODONE HCL 5 MG PO TABS
ORAL_TABLET | ORAL | Status: AC
  Filled 2022-10-29: qty 1

## 2022-10-29 MED ORDER — ACETAMINOPHEN 500 MG PO TABS
ORAL_TABLET | ORAL | Status: AC
  Administered 2022-10-29: 1000 mg via ORAL
  Filled 2022-10-29: qty 2

## 2022-10-29 MED ORDER — FAMOTIDINE 20 MG PO TABS
ORAL_TABLET | ORAL | Status: AC
  Administered 2022-10-29: 20 mg via ORAL
  Filled 2022-10-29: qty 1

## 2022-10-29 MED ORDER — MIDAZOLAM HCL 2 MG/2ML IJ SOLN
INTRAMUSCULAR | Status: AC
  Filled 2022-10-29: qty 2

## 2022-10-29 MED ORDER — ACETAMINOPHEN 10 MG/ML IV SOLN: 1000.0000 mg | Freq: Once | INTRAVENOUS | Status: DC | PRN

## 2022-10-29 MED ORDER — ROCURONIUM BROMIDE 100 MG/10ML IV SOLN
INTRAVENOUS | Status: DC | PRN
  Administered 2022-10-29: 70 mg via INTRAVENOUS

## 2022-10-29 MED ORDER — FENTANYL CITRATE (PF) 100 MCG/2ML IJ SOLN
INTRAMUSCULAR | Status: AC
  Filled 2022-10-29: qty 2

## 2022-10-29 MED ORDER — ONDANSETRON HCL 4 MG/2ML IJ SOLN
INTRAMUSCULAR | Status: DC | PRN
  Administered 2022-10-29: 4 mg via INTRAVENOUS

## 2022-10-29 MED ORDER — PROPOFOL 10 MG/ML IV BOLUS
INTRAVENOUS | Status: DC | PRN
  Administered 2022-10-29: 150 mg via INTRAVENOUS

## 2022-10-29 MED ORDER — OXYCODONE HCL 5 MG/5ML PO SOLN: 5.0000 mg | Freq: Once | ORAL | Status: AC | PRN

## 2022-10-29 MED ORDER — PROPOFOL 10 MG/ML IV BOLUS
INTRAVENOUS | Status: AC
  Filled 2022-10-29: qty 20

## 2022-10-29 MED ORDER — GABAPENTIN 300 MG PO CAPS
ORAL_CAPSULE | ORAL | Status: AC
  Administered 2022-10-29: 300 mg via ORAL
  Filled 2022-10-29: qty 1

## 2022-10-29 MED ORDER — GLYCOPYRROLATE 0.2 MG/ML IJ SOLN
INTRAMUSCULAR | Status: DC | PRN
  Administered 2022-10-29 (×2): .2 mg via INTRAVENOUS

## 2022-10-29 MED ORDER — DEXMEDETOMIDINE HCL IN NACL 80 MCG/20ML IV SOLN
INTRAVENOUS | Status: DC | PRN
  Administered 2022-10-29 (×2): 8 ug via BUCCAL
  Administered 2022-10-29: 4 ug via BUCCAL

## 2022-10-29 MED ORDER — HYDROCODONE-ACETAMINOPHEN 5-325 MG PO TABS: 1.0000 | ORAL_TABLET | Freq: Four times a day (QID) | ORAL | 0 refills | Status: DC | PRN

## 2022-10-29 MED ORDER — FENTANYL CITRATE (PF) 100 MCG/2ML IJ SOLN: 25.0000 ug | INTRAMUSCULAR | Status: DC | PRN

## 2022-10-29 MED ORDER — BUPIVACAINE HCL (PF) 0.25 % IJ SOLN
INTRAMUSCULAR | Status: AC
  Filled 2022-10-29: qty 30

## 2022-10-29 MED ORDER — FENTANYL CITRATE (PF) 100 MCG/2ML IJ SOLN
INTRAMUSCULAR | Status: DC | PRN
  Administered 2022-10-29 (×2): 50 ug via INTRAVENOUS

## 2022-10-29 MED ORDER — LIDOCAINE HCL (CARDIAC) PF 100 MG/5ML IV SOSY
PREFILLED_SYRINGE | INTRAVENOUS | Status: DC | PRN
  Administered 2022-10-29: 100 mg via INTRAVENOUS

## 2022-10-29 MED ORDER — PHENYLEPHRINE HCL (PRESSORS) 10 MG/ML IV SOLN
INTRAVENOUS | Status: AC
  Filled 2022-10-29: qty 1

## 2022-10-29 MED ORDER — MIDAZOLAM HCL 2 MG/2ML IJ SOLN
INTRAMUSCULAR | Status: DC | PRN
  Administered 2022-10-29: 2 mg via INTRAVENOUS

## 2022-10-29 MED ORDER — DEXAMETHASONE SODIUM PHOSPHATE 10 MG/ML IJ SOLN
INTRAMUSCULAR | Status: DC | PRN
  Administered 2022-10-29: 10 mg via INTRAVENOUS

## 2022-10-29 MED ORDER — ONDANSETRON HCL 4 MG/2ML IJ SOLN: 4.0000 mg | Freq: Once | INTRAMUSCULAR | Status: DC | PRN

## 2022-10-29 MED ORDER — BUPIVACAINE HCL 0.25 % IJ SOLN
INTRAMUSCULAR | Status: DC | PRN
  Administered 2022-10-29: 30 mL

## 2022-10-29 MED ORDER — OXYCODONE HCL 5 MG PO TABS
5.0000 mg | ORAL_TABLET | Freq: Once | ORAL | Status: AC | PRN
  Administered 2022-10-29: 5 mg via ORAL

## 2022-10-29 MED ORDER — CHLORHEXIDINE GLUCONATE 0.12 % MT SOLN
OROMUCOSAL | Status: AC
  Administered 2022-10-29: 15 mL via OROMUCOSAL
  Filled 2022-10-29: qty 15

## 2022-10-29 MED ORDER — CEFAZOLIN SODIUM-DEXTROSE 2-4 GM/100ML-% IV SOLN
INTRAVENOUS | Status: AC
  Filled 2022-10-29: qty 100

## 2022-10-29 MED ORDER — SUGAMMADEX SODIUM 200 MG/2ML IV SOLN
INTRAVENOUS | Status: DC | PRN
  Administered 2022-10-29: 200 mg via INTRAVENOUS

## 2022-10-29 SURGICAL SUPPLY — 51 items
ADH SKN CLS APL DERMABOND .7 (GAUZE/BANDAGES/DRESSINGS) ×2
BLADE CLIPPER SURG (BLADE) ×2 IMPLANT
COVER TIP SHEARS 8 DVNC (MISCELLANEOUS) ×2 IMPLANT
COVER TIP SHEARS 8MM DA VINCI (MISCELLANEOUS) ×2
COVER WAND RF STERILE (DRAPES) ×2 IMPLANT
DERMABOND ADVANCED .7 DNX12 (GAUZE/BANDAGES/DRESSINGS) ×2 IMPLANT
DRAPE ARM DVNC X/XI (DISPOSABLE) ×6 IMPLANT
DRAPE COLUMN DVNC XI (DISPOSABLE) ×2 IMPLANT
DRAPE DA VINCI XI ARM (DISPOSABLE) ×6
DRAPE DA VINCI XI COLUMN (DISPOSABLE) ×2
DRAPE UTILITY 15X26 TOWEL STRL (DRAPES) ×2 IMPLANT
ELECT REM PT RETURN 9FT ADLT (ELECTROSURGICAL) ×2
ELECTRODE REM PT RTRN 9FT ADLT (ELECTROSURGICAL) ×2 IMPLANT
GLOVE ORTHO TXT STRL SZ7.5 (GLOVE) ×6 IMPLANT
GOWN STRL REUS W/ TWL LRG LVL3 (GOWN DISPOSABLE) ×2 IMPLANT
GOWN STRL REUS W/ TWL XL LVL3 (GOWN DISPOSABLE) ×4 IMPLANT
GOWN STRL REUS W/TWL LRG LVL3 (GOWN DISPOSABLE) ×4
GOWN STRL REUS W/TWL XL LVL3 (GOWN DISPOSABLE) ×4
GRASPER SUT TROCAR 14GX15 (MISCELLANEOUS) IMPLANT
IRRIGATION STRYKERFLOW (MISCELLANEOUS) IMPLANT
IRRIGATOR STRYKERFLOW (MISCELLANEOUS)
IV NS 1000ML (IV SOLUTION)
IV NS 1000ML BAXH (IV SOLUTION) IMPLANT
KIT PINK PAD W/HEAD ARE REST (MISCELLANEOUS) ×2
KIT PINK PAD W/HEAD ARM REST (MISCELLANEOUS) ×2 IMPLANT
LABEL OR SOLS (LABEL) ×2 IMPLANT
MANIFOLD NEPTUNE II (INSTRUMENTS) ×2 IMPLANT
MESH 3DMAX LIGHT 4.8X6.7 RT XL (Mesh General) IMPLANT
NDL INSUFFLATION 14GA 120MM (NEEDLE) IMPLANT
NEEDLE HYPO 22GX1.5 SAFETY (NEEDLE) ×2 IMPLANT
NEEDLE INSUFFLATION 14GA 120MM (NEEDLE) ×2 IMPLANT
PACK LAP CHOLECYSTECTOMY (MISCELLANEOUS) ×2 IMPLANT
SEAL CANN UNIV 5-8 DVNC XI (MISCELLANEOUS) ×6 IMPLANT
SEAL XI 5MM-8MM UNIVERSAL (MISCELLANEOUS) ×6
SET TUBE SMOKE EVAC HIGH FLOW (TUBING) ×2 IMPLANT
SOL ELECTROSURG ANTI STICK (MISCELLANEOUS) ×2
SOLUTION ELECTROSURG ANTI STCK (MISCELLANEOUS) ×2 IMPLANT
SUT MNCRL 4-0 (SUTURE) ×4
SUT MNCRL 4-0 27XMFL (SUTURE) ×4
SUT V-LOC 90 ABS 3-0 VLT  V-20 (SUTURE)
SUT V-LOC 90 ABS 3-0 VLT V-20 (SUTURE) IMPLANT
SUT VIC AB 2-0 SH 27 (SUTURE) ×2
SUT VIC AB 2-0 SH 27XBRD (SUTURE) ×2 IMPLANT
SUT VIC AB 3-0 SH 27 (SUTURE) ×2
SUT VIC AB 3-0 SH 27X BRD (SUTURE) IMPLANT
SUT VICRYL 0 AB UR-6 (SUTURE) IMPLANT
SUT VLOC 90 2/L VL 12 GS22 (SUTURE) IMPLANT
SUT VLOC 90 S/L VL9 GS22 (SUTURE) IMPLANT
SUTURE MNCRL 4-0 27XMF (SUTURE) ×2 IMPLANT
TRAP FLUID SMOKE EVACUATOR (MISCELLANEOUS) ×2 IMPLANT
WATER STERILE IRR 500ML POUR (IV SOLUTION) ×2 IMPLANT

## 2022-10-29 NOTE — Discharge Instructions (Signed)

## 2022-10-29 NOTE — Interval H&P Note (Signed)
History and Physical Interval Note:  10/29/2022 7:19 AM  Brett Bell  has presented today for surgery, with the diagnosis of inguinal hernia.  The various methods of treatment have been discussed with the patient and family. After consideration of risks, benefits and other options for treatment, the patient has consented to  Procedure(s): XI ROBOTIC ASSISTED INGUINAL HERNIA (Right) INSERTION OF MESH as a surgical intervention.  The patient's history has been reviewed, patient examined, no change in status, stable for surgery.  I have reviewed the patient's chart and labs.  Questions were answered to the patient's satisfaction.   The right side is marked.   Ronny Bacon

## 2022-10-29 NOTE — Anesthesia Postprocedure Evaluation (Signed)
Anesthesia Post Note  Patient: Brett Bell  Procedure(s) Performed: XI ROBOTIC ASSISTED INGUINAL HERNIA (Right) INSERTION OF MESH  Patient location during evaluation: PACU Anesthesia Type: General Level of consciousness: awake and alert Pain management: pain level controlled Vital Signs Assessment: post-procedure vital signs reviewed and stable Respiratory status: spontaneous breathing, nonlabored ventilation, respiratory function stable and patient connected to nasal cannula oxygen Cardiovascular status: blood pressure returned to baseline and stable Postop Assessment: no apparent nausea or vomiting Anesthetic complications: no   No notable events documented.   Last Vitals:  Vitals:   10/29/22 1000 10/29/22 1010  BP: (!) 120/90 121/89  Pulse: 61 60  Resp: 16 18  Temp:  36.5 C  SpO2: 97% 97%    Last Pain:  Vitals:   10/29/22 1010  TempSrc:   PainSc: 3                  Arita Miss

## 2022-10-29 NOTE — Anesthesia Procedure Notes (Signed)
Procedure Name: Intubation Date/Time: 10/29/2022 7:39 AM  Performed by: Biagio Borg, CRNAPre-anesthesia Checklist: Patient identified, Emergency Drugs available, Suction available and Patient being monitored Patient Re-evaluated:Patient Re-evaluated prior to induction Oxygen Delivery Method: Circle system utilized Preoxygenation: Pre-oxygenation with 100% oxygen Induction Type: IV induction Ventilation: Mask ventilation without difficulty Laryngoscope Size: McGraph and 4 Grade View: Grade I Tube type: Oral Number of attempts: 1 Airway Equipment and Method: Stylet and Oral airway Placement Confirmation: ETT inserted through vocal cords under direct vision, positive ETCO2 and breath sounds checked- equal and bilateral Secured at: 25 cm Tube secured with: Tape Dental Injury: Teeth and Oropharynx as per pre-operative assessment

## 2022-10-29 NOTE — Op Note (Signed)
Robotic assisted Laparoscopic Transabdominal right inguinal Hernia Repair with Mesh       Pre-operative Diagnosis: Right inguinal Hernia   Post-operative Diagnosis: Same   Procedure: Robotic assisted Laparoscopic  repair of right inguinal hernia(s)   Surgeon: Ronny Bacon, M.D., FACS   Anesthesia: GETA   Findings: Right indirect inguinal hernia, no evidence of left sided hernia.         Procedure Details  The patient was seen again in the Holding Room. The benefits, complications, treatment options, and expected outcomes were discussed with the patient. The risks of bleeding, infection, recurrence of symptoms, failure to resolve symptoms, recurrence of hernia, ischemic orchitis, chronic pain syndrome or neuroma, were reviewed again. The likelihood of improving the patient's symptoms with return to their baseline status is good.  The patient and/or family concurred with the proposed plan, giving informed consent.  The patient was taken to Operating Room, identified  and the procedure verified as Laparoscopic Inguinal Hernia Repair. Laterality confirmed.  A Time Out was held and the above information confirmed.   Prior to the induction of general anesthesia, antibiotic prophylaxis was administered. VTE prophylaxis was in place. General endotracheal anesthesia was then administered and tolerated well. After the induction, the abdomen was prepped with Chloraprep and draped in the sterile fashion. The patient was positioned in the supine position.   After local infiltration of quarter percent Marcaine with epinephrine, stab incision was made left upper quadrant.  On the left at Palmer's point, the Veress needle is passed with sensation of the layers to penetrate the abdominal wall and into the peritoneum.  Saline drop test is confirmed peritoneal placement.  Insufflation is initiated with carbon dioxide to pressures of 15 mmHg. An 8.5 mm port is placed to the left off of the midline, with blunt  tipped trocar.  Pneumoperitoneum maintained w/o HD changes to pressures of 15 mm Hg with CO2. No evidence of bowel injuries.  Two 8.5 mm ports placed under direct vision in each upper quadrant. The laparoscopy revealed a right-sided indirect defect(s).   The robot was brought ot the table and docked in the standard fashion, no collision between arms was observed. Instruments were kept under direct view at all times. For right inguinal hernia repair,  I developed a peritoneal flap. The sac(s) were reduced and dissected free from adjacent structures. We preserved the vas and the vessels, and visualized them to their convergence and beyond in the retroperitoneum. Once dissection was completed an extra-large right sided BARD 3D Light mesh was placed and secured at 4 points with interrupted 0 Vicryl to the pubic tubercle and anteriorly. There was good coverage of the direct, indirect and femoral spaces. Second look revealed no complications or injuries.  The flap was then closed with 3-0 V-lock suture.  Peritoneal closure without defects.  Once assuring that hemostasis was adequate, all needles/sponges removed, and the robot was undocked.  Under direct visualization I placed the Veress needle into the preperitoneal space the Veress' valve was released allowing extraperitoneal CO2 to escape, it was also used to access the space for supplemental local anesthesia. The ports were removed, the abdomen desulflated.  4-0 subcuticular Monocryl was used at all skin edges. Dermabond was placed.  Patient tolerated the procedure well. There were no complications. He was taken to the recovery room in stable condition.           Ronny Bacon, M.D., FACS 10/29/2022, 9:17 AM

## 2022-10-29 NOTE — Transfer of Care (Signed)
Immediate Anesthesia Transfer of Care Note  Patient: Brett Bell  Procedure(s) Performed: XI ROBOTIC ASSISTED INGUINAL HERNIA (Right) INSERTION OF MESH  Patient Location: PACU  Anesthesia Type:General  Level of Consciousness: sedated  Airway & Oxygen Therapy: Patient Spontanous Breathing  Post-op Assessment: Report given to RN and Post -op Vital signs reviewed and stable  Post vital signs: Reviewed and stable  Last Vitals:  Vitals Value Taken Time  BP    Temp    Pulse    Resp    SpO2      Last Pain:  Vitals:   10/29/22 0622  TempSrc: Temporal  PainSc: 0-No pain         Complications: No notable events documented.

## 2022-10-29 NOTE — Anesthesia Preprocedure Evaluation (Signed)
Anesthesia Evaluation  Patient identified by MRN, date of birth, ID band Patient awake    Reviewed: Allergy & Precautions, NPO status , Patient's Chart, lab work & pertinent test results  History of Anesthesia Complications Negative for: history of anesthetic complications  Airway Mallampati: III  TM Distance: >3 FB Neck ROM: Full    Dental no notable dental hx. (+) Teeth Intact   Pulmonary sleep apnea , neg COPD, Patient abstained from smoking.Not current smoker   Pulmonary exam normal breath sounds clear to auscultation       Cardiovascular Exercise Tolerance: Good METShypertension, + CAD  (-) Past MI + dysrhythmias  Rhythm:Regular Rate:Normal - Systolic murmurs    Neuro/Psych negative neurological ROS  negative psych ROS   GI/Hepatic ,GERD  Medicated,,(+)     (-) substance abuse    Endo/Other  neg diabetes    Renal/GU negative Renal ROS     Musculoskeletal   Abdominal   Peds  Hematology   Anesthesia Other Findings Past Medical History: No date: Anemia     Comment:  ONLY AS A CHILD No date: Anginal pain ([place]) No date: Bradycardia 03/16/2012: CAD (coronary artery disease)     Comment:  a.) LHC 03/16/2012: 40% mLAD, minor luminal irregs in               mLCx and pRCA - med mgmt. No date: ED (erectile dysfunction)     Comment:  a.) on PDE5i (sildenafil) No date: GERD (gastroesophageal reflux disease) No date: Hyperlipidemia No date: Hypertension No date: Inguinal hernia, bilateral No date: Obesity No date: Palpitations 2012: Prostate cancer ([place])     Comment:  a.) s/p prostatectomy No date: Sleep apnea     Comment:  a.) does not require nocturnal PAP therapy  Reproductive/Obstetrics                              Anesthesia Physical Anesthesia Plan  ASA: 2  Anesthesia Plan: General   Post-op Pain Management: Tylenol PO (pre-op)*, Celebrex PO (pre-op)* and Gabapentin PO  (pre-op)*   Induction: Intravenous  PONV Risk Score and Plan: 3 and Ondansetron, Dexamethasone and Midazolam  Airway Management Planned: Oral ETT and Video Laryngoscope Planned  Additional Equipment: None  Intra-op Plan:   Post-operative Plan: Extubation in OR  Informed Consent: I have reviewed the patients History and Physical, chart, labs and discussed the procedure including the risks, benefits and alternatives for the proposed anesthesia with the patient or authorized representative who has indicated his/her understanding and acceptance.     Dental advisory given  Plan Discussed with: CRNA and Surgeon  Anesthesia Plan Comments: (Discussed risks of anesthesia with patient, including PONV, sore throat, lip/dental/eye damage. Rare risks discussed as well, such as cardiorespiratory and neurological sequelae, and allergic reactions. Discussed the role of CRNA in patient's perioperative care. Patient understands.)         Anesthesia Quick Evaluation

## 2022-10-30 ENCOUNTER — Encounter: Payer: Self-pay | Admitting: Surgery

## 2022-11-13 ENCOUNTER — Ambulatory Visit (INDEPENDENT_AMBULATORY_CARE_PROVIDER_SITE_OTHER): Payer: Commercial Managed Care - HMO | Admitting: Physician Assistant

## 2022-11-13 ENCOUNTER — Encounter: Payer: Self-pay | Admitting: Physician Assistant

## 2022-11-13 VITALS — BP 107/75 | HR 74 | Temp 98.0°F | Ht 76.0 in | Wt 247.0 lb

## 2022-11-13 DIAGNOSIS — Z09 Encounter for follow-up examination after completed treatment for conditions other than malignant neoplasm: Secondary | ICD-10-CM

## 2022-11-13 DIAGNOSIS — K4090 Unilateral inguinal hernia, without obstruction or gangrene, not specified as recurrent: Secondary | ICD-10-CM

## 2022-11-13 NOTE — Progress Notes (Signed)
[facility] POST-OP OFFICE VISIT  11/13/2022  HPI: Brett Bell is a 62 y.o. male 15 days s/p robotic assisted laparoscopic right inguinal hernia repair with Dr Christian Mate   He has done exceptionally well He had very minimal soreness which is now resolved No fever, chills, nausea, emesis, or changes in his bowels Incisions are well healed No scrotal swelling or ecchymosis Ambulating No other complaints   Vital signs: BP 107/75   Pulse 74   Temp 98 F (36.7 C)   Ht '6\' 4"'$  (1.93 m)   Wt 247 lb (112 kg)   SpO2 97%   BMI 30.07 kg/m    Physical Exam: Constitutional: Well appearing male, NAD Abdomen: Soft, non-tender, non-distended, no rebound/guarding Skin: Laparoscopic incisions are healing well, no erythema or drainage   Assessment/Plan: This is a 63 y.o. male 15 days s/p robotic assisted laparoscopic right inguinal hernia repair with Dr Christian Mate    - Pain control prn  - Reviewed wound care recommendation  - Reviewed lifting restrictions; 4-6 weeks total  - He can follow up on as needed basis; He understands to call with questions/concerns  -- Edison Simon, PA-C [facility] 11/13/2022, 3:12 PM M-F: 7am - 4pm

## 2022-11-13 NOTE — Patient Instructions (Signed)

## 2023-03-23 ENCOUNTER — Other Ambulatory Visit: Payer: Self-pay | Admitting: Family Medicine

## 2023-03-23 DIAGNOSIS — Z0000 Encounter for general adult medical examination without abnormal findings: Secondary | ICD-10-CM

## 2023-03-23 DIAGNOSIS — Z9189 Other specified personal risk factors, not elsewhere classified: Secondary | ICD-10-CM

## 2023-04-06 ENCOUNTER — Other Ambulatory Visit: Payer: Commercial Managed Care - HMO

## 2023-05-14 ENCOUNTER — Other Ambulatory Visit: Payer: Self-pay | Admitting: Nurse Practitioner

## 2023-05-14 DIAGNOSIS — Z8249 Family history of ischemic heart disease and other diseases of the circulatory system: Secondary | ICD-10-CM

## 2023-05-14 DIAGNOSIS — I10 Essential (primary) hypertension: Secondary | ICD-10-CM

## 2023-05-14 DIAGNOSIS — R079 Chest pain, unspecified: Secondary | ICD-10-CM

## 2023-05-14 DIAGNOSIS — Z9189 Other specified personal risk factors, not elsewhere classified: Secondary | ICD-10-CM

## 2023-05-14 DIAGNOSIS — E785 Hyperlipidemia, unspecified: Secondary | ICD-10-CM

## 2023-05-15 ENCOUNTER — Telehealth (HOSPITAL_COMMUNITY): Payer: Self-pay | Admitting: *Deleted

## 2023-05-15 MED ORDER — METOPROLOL TARTRATE 100 MG PO TABS: ORAL_TABLET | ORAL | 0 refills | Status: AC

## 2023-05-15 NOTE — Telephone Encounter (Signed)
Reaching out to patient to offer assistance regarding upcoming cardiac imaging study; pt verbalizes understanding of appt date/time, parking situation and where to check in, pre-test NPO status and medications ordered, and verified current allergies; name and call back number provided for further questions should they arise  Larey Brick RN Navigator Cardiac Imaging [facility] [phone] office [phone] cell  Patient to take 100mg  metoprolol tartrate two hours prior to his cardiac CT scan.

## 2023-05-18 ENCOUNTER — Ambulatory Visit
Admission: RE | Admit: 2023-05-18 | Discharge: 2023-05-18 | Disposition: A | Discharge status: Home / Self Care | Payer: Commercial Managed Care - HMO | Source: Ambulatory Visit | Attending: Nurse Practitioner | Admitting: Nurse Practitioner

## 2023-05-18 DIAGNOSIS — E785 Hyperlipidemia, unspecified: Secondary | ICD-10-CM | POA: Diagnosis present

## 2023-05-18 DIAGNOSIS — R079 Chest pain, unspecified: Secondary | ICD-10-CM | POA: Diagnosis present

## 2023-05-18 DIAGNOSIS — I10 Essential (primary) hypertension: Secondary | ICD-10-CM

## 2023-05-18 DIAGNOSIS — Z9189 Other specified personal risk factors, not elsewhere classified: Secondary | ICD-10-CM | POA: Diagnosis present

## 2023-05-18 DIAGNOSIS — Z8249 Family history of ischemic heart disease and other diseases of the circulatory system: Secondary | ICD-10-CM | POA: Diagnosis present

## 2023-05-18 MED ORDER — NITROGLYCERIN 0.4 MG SL SUBL
0.8000 mg | SUBLINGUAL_TABLET | Freq: Once | SUBLINGUAL | Status: AC
  Administered 2023-05-18: 0.8 mg via SUBLINGUAL
  Filled 2023-05-18: qty 25

## 2023-05-18 MED ORDER — IOHEXOL 350 MG/ML SOLN
100.0000 mL | Freq: Once | INTRAVENOUS | Status: AC | PRN
  Administered 2023-05-18: 100 mL via INTRAVENOUS

## 2023-05-18 NOTE — Progress Notes (Signed)
Pt tolerated procedure well with no issues. Pt ABCs intact. Pt denies any complaints. Pt encouraged to drink plenty of water throughout the day. Pt ambulatory with steady gait.

## 2023-05-21 ENCOUNTER — Ambulatory Visit: Payer: Commercial Managed Care - HMO | Admitting: Surgery

## 2023-05-26 ENCOUNTER — Encounter: Payer: Self-pay | Admitting: Urology

## 2023-05-26 ENCOUNTER — Ambulatory Visit: Payer: Commercial Managed Care - HMO | Admitting: Urology

## 2023-05-26 VITALS — BP 116/81 | HR 73 | Ht 76.0 in | Wt 252.4 lb

## 2023-05-26 DIAGNOSIS — Z8719 Personal history of other diseases of the digestive system: Secondary | ICD-10-CM | POA: Diagnosis not present

## 2023-05-26 DIAGNOSIS — R1031 Right lower quadrant pain: Secondary | ICD-10-CM | POA: Diagnosis not present

## 2023-05-26 NOTE — Patient Instructions (Signed)
   I also highly recommend the "[employer] 3" exercises for low back pain.  Further information can be found online on how to perform those exercises

## 2023-05-26 NOTE — Progress Notes (Signed)
05/26/2023 9:22 AM   Cristine Polio 1961/08/07 [id number]  Reason for visit: Right groin pain  HPI: 62 year old male originally followed by Dr. Sheppard Penton and treated for low risk prostate cancer in 2012 with whole gland HIFU.  After Dr. Terrance Mass retirement he establish care with me in February 2024, and PSA has remained undetectable since that time.  He has ED well-controlled on 20 mg sildenafil on demand, also has a history of a sebaceous cyst removal from the scrotum in 2019 by Dr. Sheppard Penton.  He had a right-sided inguinal hernia repaired by Dr. Claudine Mouton with mesh in February 2024.  He has had a few weeks of some intermittent right-sided low back, leg, and groin pain and made an appointment with [place] today.  He feels like things have improved over the last few days.  On exam I do not appreciate any recurrence of his right inguinal hernia.  Presentation sounds more musculoskeletal in nature, pelvic floor/groin/low back stretching and strengthening exercises provided, recommended reaching out to Dr. Toula Moos if no improvement over the next few weeks.  Keep February 2025 follow-up as scheduled for PSA follow-up for low risk prostate cancer  Sondra Come, MD  [facility] [address], [address] [place], [place] [address] [phone]

## 2023-06-02 ENCOUNTER — Ambulatory Visit (INDEPENDENT_AMBULATORY_CARE_PROVIDER_SITE_OTHER): Payer: Managed Care, Other (non HMO) | Admitting: Surgery

## 2023-06-02 ENCOUNTER — Encounter: Payer: Self-pay | Admitting: Surgery

## 2023-06-02 VITALS — BP 130/88 | HR 70 | Temp 98.2°F | Ht 76.0 in | Wt 254.0 lb

## 2023-06-02 DIAGNOSIS — R1031 Right lower quadrant pain: Secondary | ICD-10-CM | POA: Insufficient documentation

## 2023-06-02 NOTE — Progress Notes (Signed)
Patient ID: Brett Bell, male   DOB: March 04, 1961, 62 y.o.   MRN: [id number]  Chief Complaint: Right groin pain  History of Present Illness Brett Bell is a 62 y.o. male with a new onset of right groin pain, seems to be exacerbated with walking.  He denies any recent history of any unusual lifting, or weight lifting workout, etc.  Does not have any significant pain while doing any of these activities.  He also is also recently attempting to medicate for some right sided sciatic pain on the same side.  He reports it feels consistent with when he had his hernia, however he denies palpating any bulge, and direct palpation to the area of his hernia does not seem to reproduce the symptoms at all.  Again coughing or sneezing does not seem to provoke the pain but it is appreciated while walking, and yet the pain does not seem to be involving the motion of the right hip.  Past Medical History Past Medical History:  Diagnosis Date   Anemia    ONLY AS A CHILD   Anginal pain ([facility])    Bradycardia    CAD (coronary artery disease) 03/16/2012   a.) LHC 03/16/2012: 40% mLAD, minor luminal irregs in mLCx and pRCA - med mgmt.   ED (erectile dysfunction)    a.) on PDE5i (sildenafil)   GERD (gastroesophageal reflux disease)    Hyperlipidemia    Hypertension    Inguinal hernia, bilateral    Obesity    Palpitations    Prostate cancer ([facility]) 2012   a.) s/p prostatectomy   Sleep apnea    a.) does not require nocturnal PAP therapy      Past Surgical History:  Procedure Laterality Date   COLONOSCOPY  2018   INGUINAL HERNIA REPAIR     INSERTION OF MESH  10/29/2022   Procedure: INSERTION OF MESH;  Surgeon: Campbell Lerner, MD;  Location: [facility] ORS;  Service: General;;   LEFT HEART CATH AND CORONARY ANGIOGRAPHY Left 03/16/2012   Procedure: LEFT HEART CATH AND CORONARY ANGIOGRAPHY; Location: [facility]; Surgeon: Adrian Blackwater, MD   PROSTATECTOMY  2012   SCROTAL EXPLORATION N/A 07/27/2018   Procedure: SCROTUM  EXPLORATION;  Surgeon: Orson Ape, MD;  Location: [facility] ORS;  Service: Urology;  Laterality: N/A;    No Known Allergies  Current Outpatient Medications  Medication Sig Dispense Refill   aspirin EC 81 MG tablet Take 81 mg by mouth daily.     atorvastatin (LIPITOR) 40 MG tablet Take 40 mg by mouth daily.     escitalopram (LEXAPRO) 5 MG tablet Take 5 mg by mouth daily.     fluticasone (FLONASE) 50 MCG/ACT nasal spray Place 2 sprays into both nostrils as needed.     gabapentin (NEURONTIN) 100 MG capsule Take 100 mg by mouth daily.     hydrochlorothiazide (HYDRODIURIL) 12.5 MG tablet Take 12.5 mg by mouth daily.     ibuprofen (ADVIL) 800 MG tablet Take 1 tablet (800 mg total) by mouth 3 (three) times daily. (Patient taking differently: Take 800 mg by mouth every 8 (eight) hours as needed.) 21 tablet 0   loratadine (CLARITIN) 10 MG tablet Take 10 mg by mouth every morning.     metoprolol succinate (TOPROL-XL) 25 MG 24 hr tablet Take 25 mg by mouth daily.     metoprolol tartrate (LOPRESSOR) 100 MG tablet Take tablet (100mg ) TWO hours prior to your cardiac CT scan. 1 tablet 0   Omega-3 Fatty Acids (FISH OIL)  1000 MG CAPS Take 1,000 mg by mouth daily.     pregabalin (LYRICA) 25 MG capsule Take 25 mg by mouth 2 (two) times daily.     sildenafil (REVATIO) 20 MG tablet Take 1 tablet (20 mg total) by mouth daily as needed (prior to sexual activity). 90 tablet 3   vitamin C (ASCORBIC ACID) 500 MG tablet Take 500 mg by mouth daily.     No current facility-administered medications for this visit.    Family History No family history on file.    Social History Social History   Tobacco Use   Smoking status: Never    Passive exposure: Never   Smokeless tobacco: Never  Vaping Use   Vaping status: Never Used  Substance Use Topics   Alcohol use: Yes    Comment: wine occ   Drug use: Never        Review of Systems  All other systems reviewed and are negative.    Physical Exam Blood  pressure 130/88, pulse 70, temperature 98.2 F (36.8 C), height 6\' 4"  (1.93 m), weight 254 lb (115.2 kg), SpO2 98%. Last Weight  Most recent update: 06/02/2023  2:08 PM    Weight  115.2 kg (254 lb)             CONSTITUTIONAL: Well developed, and nourished, appropriately responsive and aware without distress.   EYES: Sclera non-icteric.   EARS, NOSE, MOUTH AND THROAT:  The oropharynx is clear. Oral mucosa is pink and moist.    Hearing is intact to voice.  NECK: Trachea is midline, and there is no jugular venous distension.  LYMPH NODES:  Lymph nodes in the neck are not appreciated. RESPIRATORY:   Normal respiratory effort without pathologic use of accessory muscles. CARDIOVASCULAR:  Well perfused.  GI: The abdomen is soft, nontender, and nondistended. There were no palpable masses. GU: No appreciable right sided inguinal hernia on exam.  Nontender spermatic cord.  No other proximal scrotal masses or tenderness. MUSCULOSKELETAL:  Symmetrical muscle tone appreciated in all four extremities.    SKIN: Skin turgor is normal. No pathologic skin lesions appreciated.  NEUROLOGIC:  Motor and sensation appear grossly normal.  Cranial nerves are grossly without defect. PSYCH:  Alert and oriented to person, place and time. Affect is appropriate for situation.  Data Reviewed I have personally reviewed what is currently available of the patient's imaging, recent labs and medical records.   Labs:     Latest Ref Rng & Units 10/27/2022    9:18 AM  CBC  WBC 4.0 - 10.5 K/uL 5.2   Hemoglobin 13.0 - 17.0 g/dL 57.3   Hematocrit 22.0 - 52.0 % 44.4   Platelets 150 - 400 K/uL 158       Latest Ref Rng & Units 10/27/2022    9:18 AM  CMP  Glucose 70 - 99 mg/dL 81   BUN 6 - 20 mg/dL 15   Creatinine 2.54 - 1.24 mg/dL 2.70   Sodium 623 - 762 mmol/L 140   Potassium 3.5 - 5.1 mmol/L 3.9   Chloride 98 - 111 mmol/L 105   CO2 22 - 32 mmol/L 29   Calcium 8.9 - 10.3 mg/dL 9.1   Total Protein 6.5 - 8.1 g/dL  6.8   Total Bilirubin 0.3 - 1.2 mg/dL 0.6   Alkaline Phos 38 - 126 U/L 72   AST 15 - 41 U/L 20   ALT 0 - 44 U/L 20       Imaging: Radiological images  reviewed:   Within last 24 hrs: No results found.  Assessment    New onset right groin pain. Patient Active Problem List   Diagnosis Date Noted   Right inguinal hernia 10/14/2022    Plan    Will obtain pelvic CT scan to rule out recurrent hernia as etiology.   Face-to-face time spent with the patient and accompanying care providers(if present) was 20 minutes, with more than 50% of the time spent counseling, educating, and coordinating care of the patient.    These notes generated with voice recognition software. I apologize for typographical errors.  Campbell Lerner M.D., FACS 06/02/2023, 2:31 PM

## 2023-06-02 NOTE — Patient Instructions (Addendum)
We will get you scheduled to have a CT of the pelvis.    You are scheduled for a CT of the pelvis with contrast at [facility] on Tuesday October 15th. You will need to arrive at the [facility] entrance at 8:45 am.   We will call you with your results and any next steps.

## 2023-06-16 ENCOUNTER — Ambulatory Visit
Admission: RE | Admit: 2023-06-16 | Discharge: 2023-06-16 | Disposition: A | Discharge status: Home / Self Care | Payer: Commercial Managed Care - HMO | Source: Ambulatory Visit | Attending: Surgery | Admitting: Surgery

## 2023-06-16 DIAGNOSIS — R1031 Right lower quadrant pain: Secondary | ICD-10-CM | POA: Insufficient documentation

## 2023-06-16 MED ORDER — IOHEXOL 300 MG/ML  SOLN
100.0000 mL | Freq: Once | INTRAMUSCULAR | Status: AC | PRN
  Administered 2023-06-16: 100 mL via INTRAVENOUS

## 2023-09-24 ENCOUNTER — Other Ambulatory Visit: Payer: Self-pay | Admitting: Neurosurgery

## 2023-09-24 DIAGNOSIS — M47816 Spondylosis without myelopathy or radiculopathy, lumbar region: Secondary | ICD-10-CM

## 2023-10-07 ENCOUNTER — Ambulatory Visit
Admission: RE | Admit: 2023-10-07 | Discharge: 2023-10-07 | Disposition: A | Discharge status: Home / Self Care | Payer: Commercial Managed Care - HMO | Source: Ambulatory Visit | Attending: Neurosurgery | Admitting: Neurosurgery

## 2023-10-07 DIAGNOSIS — M47816 Spondylosis without myelopathy or radiculopathy, lumbar region: Secondary | ICD-10-CM | POA: Diagnosis present

## 2023-10-12 ENCOUNTER — Other Ambulatory Visit: Payer: Self-pay

## 2023-10-14 ENCOUNTER — Ambulatory Visit: Payer: Commercial Managed Care - HMO | Admitting: Urology

## 2023-10-15 ENCOUNTER — Other Ambulatory Visit: Payer: Self-pay

## 2023-10-15 DIAGNOSIS — C61 Malignant neoplasm of prostate: Secondary | ICD-10-CM

## 2023-10-16 ENCOUNTER — Encounter: Payer: Self-pay | Admitting: Urology

## 2023-10-16 ENCOUNTER — Other Ambulatory Visit: Payer: Commercial Managed Care - HMO

## 2023-10-22 ENCOUNTER — Ambulatory Visit: Payer: Self-pay | Admitting: Urology

## 2023-10-26 ENCOUNTER — Other Ambulatory Visit: Payer: Commercial Managed Care - HMO

## 2023-10-29 ENCOUNTER — Telehealth: Payer: Self-pay | Admitting: Urology

## 2023-10-29 NOTE — Telephone Encounter (Signed)
 LMOM for pt to call office to r/s PSA lab appt he missed on Monday 2/24.

## 2023-11-30 ENCOUNTER — Ambulatory Visit: Payer: Commercial Managed Care - HMO | Admitting: Urology

## 2023-11-30 ENCOUNTER — Encounter: Payer: Self-pay | Admitting: Urology
